# Patient Record
Sex: Male | Born: 1995 | Race: Black or African American | Hispanic: No | Marital: Single | Smoking: Never smoker
Health system: Southern US, Community
[De-identification: ages and names within clinical notes are randomized; demographics above are authoritative.]

## PROBLEM LIST (undated history)

## (undated) DIAGNOSIS — K259 Gastric ulcer, unspecified as acute or chronic, without hemorrhage or perforation: Secondary | ICD-10-CM

## (undated) HISTORY — PX: SHOULDER SURGERY: SHX246

## (undated) HISTORY — PX: ANKLE FRACTURE SURGERY: SHX122

---
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
## 2014-03-23 ENCOUNTER — Emergency Department (HOSPITAL_BASED_OUTPATIENT_CLINIC_OR_DEPARTMENT_OTHER)
Admission: EM | Admit: 2014-03-23 | Discharge: 2014-03-24 | Disposition: A | Discharge status: Home / Self Care | Payer: No Typology Code available for payment source | Attending: Emergency Medicine | Admitting: Emergency Medicine

## 2014-03-23 ENCOUNTER — Encounter (HOSPITAL_BASED_OUTPATIENT_CLINIC_OR_DEPARTMENT_OTHER): Payer: Self-pay | Admitting: Emergency Medicine

## 2014-03-23 DIAGNOSIS — R21 Rash and other nonspecific skin eruption: Secondary | ICD-10-CM | POA: Insufficient documentation

## 2014-03-23 MED ORDER — VALACYCLOVIR HCL 1 G PO TABS: 1000.0000 mg | ORAL_TABLET | Freq: Two times a day (BID) | ORAL | Status: AC

## 2014-03-23 NOTE — ED Notes (Signed)
Pt denies penile discharge, dysuria, or low abdominal pain.  Patient educated extensively about the use of protection at every sexual encounter.  Informed of the danger of STI's, the effect on his health, and being an asymptomatic carrier of STI's.

## 2014-03-23 NOTE — ED Notes (Signed)
Pt reports blistered rash to genital area for 3 days- last sexual contact 1 week ago

## 2014-03-23 NOTE — ED Provider Notes (Signed)
CSN: [id number]     Arrival date & time 03/23/14  2136 History   First MD Initiated Contact with Patient 03/23/14 2324     Chief Complaint  Patient presents with  . Rash     (Consider location/radiation/quality/duration/timing/severity/associated sxs/prior Treatment) HPI This is a 18 year old male who's been sexually active with 2 new partners in the last 2 weeks. He has been having unprotected sex. Is here with a three-day history of rash to his penile shaft and pubic region. It is not painful. It is not itchy. He denies dysuria or penile discharge. He only sometimes uses a condom. He denies any systemic complaints including fever, chills, nausea, vomiting or diarrhea. He has been coughing.  History reviewed. No pertinent past medical history. Past Surgical History  Procedure Laterality Date  . Ankle fracture surgery     Family History  Problem Relation Age of Onset  . Hyperlipidemia Father   . Hypertension Father   . Diabetes Neg Hx   . Heart attack Neg Hx   . Sudden death Neg Hx    History  Substance Use Topics  . Smoking status: Passive Smoke Exposure - Never Smoker  . Smokeless tobacco: Never Used  . Alcohol Use: No    Review of Systems  All other systems reviewed and are negative.   Allergies  Review of patient's allergies indicates no known allergies.  Home Medications  No current outpatient prescriptions on file. BP 134/81  Pulse 61  Temp(Src) 98.5 F (36.9 C) (Oral)  Resp 20  Ht 5\' 9"  (1.753 m)  Wt 175 lb (79.379 kg)  BMI 25.83 kg/m2  SpO2 100%  Physical Exam General: Well-developed, well-nourished male in no acute distress; appearance consistent with age of record HENT: normocephalic; atraumatic Eyes: pupils equal, round and reactive to light; extraocular muscles intact Neck: supple Heart: regular rate and rhythm Lungs: clear to auscultation bilaterally Abdomen: soft; nondistended; nontender; no masses or hepatosplenomegaly; bowel sounds  present GU: Tanner 4 male, circumcised; no urethral discharge; small papular rash to penile shaft and pubic area without vesicles Extremities: No deformity; full range of motion Neurologic: Awake, alert and oriented; motor function intact in all extremities and symmetric; no facial droop Skin: Warm and dry Psychiatric: Normal mood and affect     ED Course  Procedures (including critical care time)   MDM  We'll treat for herpes as a precaution although the rash is not pathognomonic. Herpes culture and GC/chlamydia probe sent.      Hanley SeamenJohn L Kash Davie, MD 03/23/14 [phone]

## 2014-03-24 LAB — HIV ANTIBODY (ROUTINE TESTING W REFLEX): HIV: NONREACTIVE

## 2014-03-25 LAB — HERPES SIMPLEX VIRUS CULTURE: CULTURE: NOT DETECTED

## 2014-03-25 LAB — GC/CHLAMYDIA PROBE AMP
CT Probe RNA: NEGATIVE
GC Probe RNA: POSITIVE — AB

## 2014-03-26 ENCOUNTER — Telehealth (HOSPITAL_BASED_OUTPATIENT_CLINIC_OR_DEPARTMENT_OTHER): Payer: Self-pay

## 2014-03-26 NOTE — Telephone Encounter (Signed)
Pt calling for STD results.  ID verified x 2.  Pt informed Chlamydia was  (-)  but gonorrhea (+).  HIV no reactive and Herpes not detected. Informed pt chart to MD for review then will call pt back.

## 2014-03-27 NOTE — Telephone Encounter (Signed)
+  Gonorrhea. Chart sent to EDP office for review. [facility]

## 2014-03-29 ENCOUNTER — Telehealth (HOSPITAL_BASED_OUTPATIENT_CLINIC_OR_DEPARTMENT_OTHER): Payer: Self-pay

## 2014-04-01 ENCOUNTER — Telehealth (HOSPITAL_BASED_OUTPATIENT_CLINIC_OR_DEPARTMENT_OTHER): Payer: Self-pay

## 2014-04-01 NOTE — Telephone Encounter (Signed)
[facility] [address] requesting copy of pts STD testing so he can be treated for STD gonorrhea.  No sheet for pt rcvd back in office to date from [facility] w/tx.  Pt signed fax cover sheet request for releasing information.  STD lab result faxed to [facility] [phone] Attn Michelle/Nancy Keeler PA-C.

## 2015-12-22 ENCOUNTER — Encounter (HOSPITAL_BASED_OUTPATIENT_CLINIC_OR_DEPARTMENT_OTHER): Payer: Self-pay | Admitting: *Deleted

## 2015-12-22 ENCOUNTER — Emergency Department (HOSPITAL_BASED_OUTPATIENT_CLINIC_OR_DEPARTMENT_OTHER): Payer: BLUE CROSS/BLUE SHIELD

## 2015-12-22 ENCOUNTER — Emergency Department (HOSPITAL_BASED_OUTPATIENT_CLINIC_OR_DEPARTMENT_OTHER)
Admission: EM | Admit: 2015-12-22 | Discharge: 2015-12-22 | Disposition: A | Discharge status: Home / Self Care | Payer: BLUE CROSS/BLUE SHIELD | Attending: Emergency Medicine | Admitting: Emergency Medicine

## 2015-12-22 DIAGNOSIS — Y9289 Other specified places as the place of occurrence of the external cause: Secondary | ICD-10-CM | POA: Diagnosis not present

## 2015-12-22 DIAGNOSIS — Y998 Other external cause status: Secondary | ICD-10-CM | POA: Diagnosis not present

## 2015-12-22 DIAGNOSIS — W57XXXA Bitten or stung by nonvenomous insect and other nonvenomous arthropods, initial encounter: Secondary | ICD-10-CM | POA: Insufficient documentation

## 2015-12-22 DIAGNOSIS — Y9389 Activity, other specified: Secondary | ICD-10-CM | POA: Diagnosis not present

## 2015-12-22 DIAGNOSIS — S80261A Insect bite (nonvenomous), right knee, initial encounter: Secondary | ICD-10-CM | POA: Diagnosis not present

## 2015-12-22 MED ORDER — DIPHENHYDRAMINE HCL 25 MG PO CAPS: 25.0000 mg | ORAL_CAPSULE | Freq: Four times a day (QID) | ORAL | Status: DC | PRN

## 2015-12-22 NOTE — Discharge Instructions (Signed)
Insect Bite Mosquitoes, flies, fleas, bedbugs, and many other insects can bite. Insect bites are different from insect stings. A sting is when poison (venom) is injected into the skin. Insect bites can cause pain or itching for a few days, but they are usually not serious. Some insects can spread diseases to people through a bite. SYMPTOMS  Symptoms of an insect bite include:  Itching or pain in the bite area.  Redness and swelling in the bite area.  An open wound (skin ulcer). In many cases, symptoms last for 2-4 days.  DIAGNOSIS  This condition is usually diagnosed based on symptoms and a physical exam. TREATMENT  Treatment is usually not needed for an insect bite. Symptoms often go away on their own. Your health care provider may recommend creams or lotions to help reduce itching. Antibiotic medicines may be prescribed if the bite becomes infected. A tetanus shot may be given in some cases. If you develop an allergic reaction to an insect bite, your health care provider will prescribe medicines to treat the reaction (antihistamines). This is rare. HOME CARE INSTRUCTIONS  Do not scratch the bite area.  Keep the bite area clean and dry. Wash the bite area daily with soap and water as told by your health care provider.  If directed, applyice to the bite area.  Put ice in a plastic bag.  Place a towel between your skin and the bag.  Leave the ice on for 20 minutes, 2-3 times per day.  To help reduce itching and swelling, try applying a baking soda paste, cortisone cream, or calamine lotion to the bite area as told by your health care provider.  Apply or take over-the-counter and prescription medicines only as told by your health care provider.  If you were prescribed an antibiotic medicine, use it as told by your health care provider. Do not stop using the antibiotic even if your condition improves.  Keep all follow-up visits as told by your health care provider. This is  important. PREVENTION   Use insect repellent. The best insect repellents contain:  DEET, picaridin, oil of lemon eucalyptus (OLE), or IR3535.  Higher amounts of an active ingredient.  When you are outdoors, wear clothing that covers your arms and legs.  Avoid opening windows that do not have window screens. SEEK MEDICAL CARE IF:  You have increased redness, swelling, or pain in the bite area.  You have a fever. SEEK IMMEDIATE MEDICAL CARE IF:   You have joint pain.   You have fluid, blood, or pus coming from the bite area.  You have a headache or neck pain.  You have unusual weakness.  You have a rash.  You have chest pain or shortness of breath.  You have abdominal pain, nausea, or vomiting.  You feel unusually tired or sleepy.   This information is not intended to replace advice given to you by your health care provider. Make sure you discuss any questions you have with your health care provider.   Document Released: 01/13/2005 Document Revised: 08/27/2015 Document Reviewed: 04/23/2015 Elsevier Interactive Patient Education 2016 [employer]  

## 2015-12-22 NOTE — ED Provider Notes (Signed)
CSN: [id number]     Arrival date & time 12/22/15  1625 History   First MD Initiated Contact with Patient 12/22/15 1750     Chief Complaint  Patient presents with  . Insect Bite     (Consider location/radiation/quality/duration/timing/severity/associated sxs/prior Treatment) Patient is a 20 y.o. male presenting with animal bite.  Animal Bite Contact animal:  Insect Animal bite location: right knee. Time since incident:  1 day Pain details:    Quality:  Aching   Severity:  Mild   Timing:  Constant   Progression:  Worsening Relieved by:  None tried Worsened by:  Nothing tried Ineffective treatments:  None tried Associated symptoms: swelling (mild)   Associated symptoms: no fever, no numbness and no rash    Kaymen Adrian is a 20 y.o. male with PMH significant for ankle fx surgery who presents with constant, mild, worsening insect bite x 1 day with assoc erythema and mild swelling on his right knee.  No fever, chills, N/V, abdominal pain.  Patient ambulatory.  No meds PTA.    History reviewed. No pertinent past medical history. Past Surgical History  Procedure Laterality Date  . Ankle fracture surgery     Family History  Problem Relation Age of Onset  . Hyperlipidemia Father   . Hypertension Father   . Diabetes Neg Hx   . Heart attack Neg Hx   . Sudden death Neg Hx    Social History  Substance Use Topics  . Smoking status: Passive Smoke Exposure - Never Smoker  . Smokeless tobacco: Never Used  . Alcohol Use: No    Review of Systems  Constitutional: Negative for fever and chills.  Gastrointestinal: Negative for nausea, vomiting and abdominal pain.  Genitourinary: Negative for dysuria, urgency, frequency, discharge, penile swelling and penile pain.  Skin: Negative for rash.  Neurological: Negative for weakness and numbness.  All other systems reviewed and are negative.     Allergies  Review of patient's allergies indicates no known allergies.  Home Medications    Prior to Admission medications   Not on File   BP 136/82 mmHg  Pulse 76  Temp(Src) 98.5 F (36.9 C) (Oral)  Resp 20  Ht 5\' 9"  (1.753 m)  Wt 81.647 kg  BMI 26.57 kg/m2  SpO2 100% Physical Exam  Constitutional: He is oriented to person, place, and time. He appears well-developed and well-nourished.  HENT:  Head: Atraumatic.  Eyes: Conjunctivae are normal. No scleral icterus.  Neck: No tracheal deviation present.  Pulmonary/Chest: Effort normal. No respiratory distress.  Musculoskeletal: Normal range of motion.  Neurological: He is alert and oriented to person, place, and time.  Strength and sensation intact bilaterally throughout lower extremities.  Gait normal.   Skin: Skin is warm and dry.  Lesion consistent with insect bite on medial aspect of right knee with surrounding erythema and mild swelling.  No induration, drainage, or signs of infection.   Psychiatric: He has a normal mood and affect. His behavior is normal.    ED Course  Procedures (including critical care time) Labs Review Labs Reviewed - No data to display  Imaging Review No results found. I have personally reviewed and evaluated these images and lab results as part of my medical decision-making.   EKG Interpretation None      MDM   Final diagnoses:  Insect bite   Patient presents with findings consistent with insect bite.  No fevers, N/V, abdominal pain.  No urinary symptoms or penile discharge.  Blood pressure 136/82,  pulse 76, temperature 98.5 F (36.9 C), temperature source Oral, resp. rate 20, height 5\' 9"  (1.753 m), weight 81.647 kg, SpO2 100 %.  On exam, lesion consistent with insect bite and mild erythema with swelling on right medial knee.  FAROM of right knee.  Patient ambulatory.  Doubt septic arthritis.  Patient appears well and non-toxic.  VSS.  Will d/c home with benadryl.  Discussed strict return precautions.  Patient agrees and acknowledges the above plan for discharge.       Cheri FowlerKayla  Jordon Bourquin, PA-C 12/22/15 1839  Margarita Grizzleanielle Ray, MD 12/22/15 2322

## 2015-12-22 NOTE — ED Notes (Signed)
Pimple on his right knee for 2 days. He thinks it is a spider bite.

## 2020-12-06 ENCOUNTER — Other Ambulatory Visit: Payer: Self-pay

## 2020-12-06 ENCOUNTER — Emergency Department (HOSPITAL_COMMUNITY): Payer: No Typology Code available for payment source

## 2020-12-06 ENCOUNTER — Encounter (HOSPITAL_COMMUNITY): Payer: Self-pay | Admitting: Emergency Medicine

## 2020-12-06 ENCOUNTER — Emergency Department (HOSPITAL_COMMUNITY)
Admission: EM | Admit: 2020-12-06 | Discharge: 2020-12-06 | Discharge status: Against Medical Advice | Payer: No Typology Code available for payment source | Attending: Emergency Medicine | Admitting: Emergency Medicine

## 2020-12-06 DIAGNOSIS — S29002A Unspecified injury of muscle and tendon of back wall of thorax, initial encounter: Secondary | ICD-10-CM | POA: Diagnosis present

## 2020-12-06 DIAGNOSIS — S29012A Strain of muscle and tendon of back wall of thorax, initial encounter: Secondary | ICD-10-CM | POA: Diagnosis not present

## 2020-12-06 DIAGNOSIS — Y92410 Unspecified street and highway as the place of occurrence of the external cause: Secondary | ICD-10-CM | POA: Diagnosis not present

## 2020-12-06 DIAGNOSIS — Z7722 Contact with and (suspected) exposure to environmental tobacco smoke (acute) (chronic): Secondary | ICD-10-CM | POA: Insufficient documentation

## 2020-12-06 DIAGNOSIS — S39012A Strain of muscle, fascia and tendon of lower back, initial encounter: Secondary | ICD-10-CM

## 2020-12-06 MED ORDER — IBUPROFEN 400 MG PO TABS: 600.0000 mg | ORAL_TABLET | Freq: Once | ORAL | Status: DC

## 2020-12-06 MED ORDER — CYCLOBENZAPRINE HCL 10 MG PO TABS: 10.0000 mg | ORAL_TABLET | Freq: Three times a day (TID) | ORAL | 0 refills | Status: DC | PRN
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
## 2020-12-06 NOTE — ED Notes (Signed)
Dr. Stevie Kern has pt's discharge instructions at triage but unable to locate pt in lobby at this time.
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
## 2020-12-06 NOTE — Discharge Instructions (Signed)
Take Tylenol Motrin as needed for pain control.  Recommend starting muscle relaxer as needed.  Note this can make you slightly drowsy and should not be taken while driving or operating heavy machinery.  If you develop other new concerning symptom, return to ER for reassessment.

## 2020-12-08 ENCOUNTER — Emergency Department (HOSPITAL_COMMUNITY)
Admission: EM | Admit: 2020-12-08 | Discharge: 2020-12-09 | Disposition: A | Discharge status: Home / Self Care | Payer: 59 | Attending: Emergency Medicine | Admitting: Emergency Medicine

## 2020-12-08 ENCOUNTER — Encounter (HOSPITAL_COMMUNITY): Payer: Self-pay | Admitting: Emergency Medicine

## 2020-12-08 ENCOUNTER — Other Ambulatory Visit: Payer: Self-pay

## 2020-12-08 DIAGNOSIS — M545 Low back pain, unspecified: Secondary | ICD-10-CM | POA: Diagnosis present

## 2020-12-08 DIAGNOSIS — Z5321 Procedure and treatment not carried out due to patient leaving prior to being seen by health care provider: Secondary | ICD-10-CM | POA: Insufficient documentation
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
## 2020-12-09 NOTE — ED Notes (Signed)
Pt did not want to stay any longer due to wait.
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
## 2022-05-10 ENCOUNTER — Other Ambulatory Visit: Payer: Self-pay

## 2022-05-10 ENCOUNTER — Encounter (HOSPITAL_BASED_OUTPATIENT_CLINIC_OR_DEPARTMENT_OTHER): Payer: Self-pay | Admitting: Emergency Medicine

## 2022-05-10 ENCOUNTER — Emergency Department (HOSPITAL_BASED_OUTPATIENT_CLINIC_OR_DEPARTMENT_OTHER)
Admission: EM | Admit: 2022-05-10 | Discharge: 2022-05-10 | Disposition: A | Discharge status: Home / Self Care | Payer: Self-pay | Attending: Emergency Medicine | Admitting: Emergency Medicine

## 2022-05-10 ENCOUNTER — Emergency Department (HOSPITAL_BASED_OUTPATIENT_CLINIC_OR_DEPARTMENT_OTHER): Payer: Self-pay

## 2022-05-10 DIAGNOSIS — R509 Fever, unspecified: Secondary | ICD-10-CM | POA: Insufficient documentation

## 2022-05-10 DIAGNOSIS — J111 Influenza due to unidentified influenza virus with other respiratory manifestations: Secondary | ICD-10-CM

## 2022-05-10 DIAGNOSIS — R52 Pain, unspecified: Secondary | ICD-10-CM | POA: Insufficient documentation

## 2022-05-10 DIAGNOSIS — R110 Nausea: Secondary | ICD-10-CM | POA: Insufficient documentation

## 2022-05-10 DIAGNOSIS — R066 Hiccough: Secondary | ICD-10-CM | POA: Insufficient documentation

## 2022-05-10 DIAGNOSIS — R059 Cough, unspecified: Secondary | ICD-10-CM | POA: Insufficient documentation

## 2022-05-10 DIAGNOSIS — Z20822 Contact with and (suspected) exposure to covid-19: Secondary | ICD-10-CM | POA: Insufficient documentation

## 2022-05-10 LAB — RESP PANEL BY RT-PCR (FLU A&B, COVID) ARPGX2
Influenza A by PCR: NEGATIVE
Influenza B by PCR: NEGATIVE
SARS Coronavirus 2 by RT PCR: NEGATIVE

## 2022-05-10 MED ORDER — CHLORPROMAZINE HCL 25 MG PO TABS: 25.0000 mg | ORAL_TABLET | Freq: Four times a day (QID) | ORAL | 0 refills | Status: DC | PRN

## 2022-05-10 MED ORDER — CHLORPROMAZINE HCL 25 MG PO TABS
25.0000 mg | ORAL_TABLET | Freq: Once | ORAL | Status: AC
  Administered 2022-05-10: 25 mg via ORAL
  Filled 2022-05-10: qty 1

## 2022-05-10 NOTE — ED Provider Notes (Signed)
[facility] EMERGENCY DEPARTMENT Provider Note   CSN: [id number] Arrival date & time: 05/10/22  1952     History  Chief Complaint  Patient presents with   Hiccups    Carlos Townsend is a 26 y.o. male.  The history is provided by the patient.  He has no significant past history and comes in with a cough throughout the day today.  He started getting sick last night with subjective fever, chills, cough productive of small amount of green sputum.  He has also had some body aches.  There was nausea, vomiting, diarrhea.  Vomiting and diarrhea stopped this morning but hiccups started this morning.  He states that he has had hiccups through most of the day, although we will have periods of 30-60 minutes where they stop.  He continues to have the cough and body aches.  Denies any sick contacts.   Home Medications Prior to Admission medications   Medication Sig Start Date End Date Taking? Authorizing Provider  cyclobenzaprine (FLEXERIL) 10 MG tablet Take 1 tablet (10 mg total) by mouth 3 (three) times daily as needed for muscle spasms. 12/06/20   Milagros Loll, MD  diphenhydrAMINE (BENADRYL) 25 mg capsule Take 1 capsule (25 mg total) by mouth every 6 (six) hours as needed. 12/22/15   Cheri Fowler, PA-C      Allergies    Patient has no known allergies.    Review of Systems   Review of Systems  All other systems reviewed and are negative.  Physical Exam Updated Vital Signs BP (!) 124/98 (BP Location: Left Arm)   Pulse 96   Temp 99.3 F (37.4 C) (Oral)   Resp 16   Ht 5\' 9"  (1.753 m)   Wt 79.4 kg   SpO2 99%   BMI 25.84 kg/m  Physical Exam Vitals and nursing note reviewed.  26 year old male, resting comfortably and in no acute distress. Vital signs are significant for elevated diastolic blood pressure. Oxygen saturation is 99%, which is normal. Head is normocephalic and atraumatic. PERRLA, EOMI. Oropharynx is clear. Neck is nontender and supple without adenopathy or  JVD. Back is nontender and there is no CVA tenderness. Lungs are clear without rales, wheezes, or rhonchi. Chest is nontender. Heart has regular rate and rhythm without murmur. Abdomen is soft, flat, nontender. Extremities have no cyanosis or edema, full range of motion is present. Skin is warm and dry without rash. Neurologic: Mental status is normal, cranial nerves are intact, moves all extremities equally.  ED Results / Procedures / Treatments   Labs (all labs ordered are listed, but only abnormal results are displayed) Labs Reviewed  RESP PANEL BY RT-PCR (FLU A&B, COVID) [id number]   Radiology DG Chest 2 View  Result Date: 05/10/2022 CLINICAL DATA:  Cough EXAM: CHEST - 2 VIEW COMPARISON:  None Available. FINDINGS: The heart size and mediastinal contours are within normal limits. Both lungs are clear. The visualized skeletal structures are unremarkable. IMPRESSION: No active cardiopulmonary disease. Electronically Signed   By: 05/12/2022 M.D.   On: 05/10/2022 23:26    Procedures Procedures    Medications Ordered in ED Medications  chlorproMAZINE (THORAZINE) tablet 25 mg (25 mg Oral Given 05/10/22 2331)    ED Course/ Medical Decision Making/ A&P                           Medical Decision Making Amount and/or Complexity of Data Reviewed Radiology: ordered.  Risk  Prescription drug management.   Influenza-like illness with subjective fever, cough, body aches, hiccups.  Vomiting and diarrhea retention initially, have subsided.  Respiratory pathogen panel was obtained and I have interpreted the results and he does not have influenza or COVID-19.  We will check chest x-ray to rule out pneumonia.  I have ordered a dose of chlorpromazine for his hiccups.  Chest x-ray shows no evidence of pneumonia.  I independently viewed the images, and agree with radiologist's interpretation.  He is discharged with a prescription for chlorpromazine, advised to use acetaminophen and ibuprofen for  fever or aching.  Final Clinical Impression(s) / ED Diagnoses Final diagnoses:  Hiccups  Influenza-like illness    Rx / DC Orders ED Discharge Orders          Ordered    chlorproMAZINE (THORAZINE) 25 MG tablet  Every 6 hours PRN        05/10/22 2331              Dione Booze, MD 05/10/22 2333

## 2022-05-10 NOTE — Discharge Instructions (Signed)
Take ibuprofen or acetaminophen as needed for fever or aches.  Make sure to drink plenty of fluids.

## 2022-05-10 NOTE — ED Triage Notes (Signed)
Patient reports intermittent hiccups since last night, states hiccups will stop for about 5-10 minutes and start back for 30-45 minutes.

## 2022-08-28 ENCOUNTER — Emergency Department (HOSPITAL_BASED_OUTPATIENT_CLINIC_OR_DEPARTMENT_OTHER)
Admission: EM | Admit: 2022-08-28 | Discharge: 2022-08-28 | Disposition: A | Discharge status: Home / Self Care | Payer: Self-pay | Attending: Emergency Medicine | Admitting: Emergency Medicine

## 2022-08-28 ENCOUNTER — Other Ambulatory Visit: Payer: Self-pay

## 2022-08-28 ENCOUNTER — Encounter (HOSPITAL_BASED_OUTPATIENT_CLINIC_OR_DEPARTMENT_OTHER): Payer: Self-pay

## 2022-08-28 ENCOUNTER — Emergency Department (HOSPITAL_BASED_OUTPATIENT_CLINIC_OR_DEPARTMENT_OTHER): Payer: Self-pay

## 2022-08-28 DIAGNOSIS — D72829 Elevated white blood cell count, unspecified: Secondary | ICD-10-CM | POA: Insufficient documentation

## 2022-08-28 DIAGNOSIS — R1010 Upper abdominal pain, unspecified: Secondary | ICD-10-CM | POA: Insufficient documentation

## 2022-08-28 DIAGNOSIS — R748 Abnormal levels of other serum enzymes: Secondary | ICD-10-CM | POA: Insufficient documentation

## 2022-08-28 DIAGNOSIS — R197 Diarrhea, unspecified: Secondary | ICD-10-CM | POA: Insufficient documentation

## 2022-08-28 DIAGNOSIS — R112 Nausea with vomiting, unspecified: Secondary | ICD-10-CM | POA: Insufficient documentation

## 2022-08-28 DIAGNOSIS — N179 Acute kidney failure, unspecified: Secondary | ICD-10-CM | POA: Insufficient documentation

## 2022-08-28 LAB — LIPASE, BLOOD: Lipase: 62 U/L — ABNORMAL HIGH (ref 11–51)

## 2022-08-28 LAB — COMPREHENSIVE METABOLIC PANEL
ALT: 18 U/L (ref 0–44)
AST: 21 U/L (ref 15–41)
Albumin: 4.4 g/dL (ref 3.5–5.0)
Alkaline Phosphatase: 78 U/L (ref 38–126)
Anion gap: 8 (ref 5–15)
BUN: 12 mg/dL (ref 6–20)
CO2: 26 mmol/L (ref 22–32)
Calcium: 9.4 mg/dL (ref 8.9–10.3)
Chloride: 105 mmol/L (ref 98–111)
Creatinine, Ser: 1.26 mg/dL — ABNORMAL HIGH (ref 0.61–1.24)
GFR, Estimated: >60 mL/min (ref >60)
Glucose, Bld: 109 mg/dL — ABNORMAL HIGH (ref 70–99)
Potassium: 3.4 mmol/L — ABNORMAL LOW (ref 3.5–5.1)
Sodium: 139 mmol/L (ref 135–145)
Total Bilirubin: 1.3 mg/dL — ABNORMAL HIGH (ref 0.3–1.2)
Total Protein: 7.8 g/dL (ref 6.5–8.1)

## 2022-08-28 LAB — CBC
HCT: 42.8 % (ref 39.0–52.0)
Hemoglobin: 14.2 g/dL (ref 13.0–17.0)
MCH: 26.3 pg (ref 26.0–34.0)
MCHC: 33.2 g/dL (ref 30.0–36.0)
MCV: 79.4 fL — ABNORMAL LOW (ref 80.0–100.0)
Platelets: 267 10*3/uL (ref 150–400)
RBC: 5.39 MIL/uL (ref 4.22–5.81)
RDW: 12.9 % (ref 11.5–15.5)
WBC: 12.8 10*3/uL — ABNORMAL HIGH (ref 4.0–10.5)
nRBC: 0 % (ref 0.0–0.2)

## 2022-08-28 LAB — URINALYSIS, ROUTINE W REFLEX MICROSCOPIC
Bilirubin Urine: NEGATIVE
Glucose, UA: NEGATIVE mg/dL
Hgb urine dipstick: NEGATIVE
Ketones, ur: 15 mg/dL — AB
Leukocytes,Ua: NEGATIVE
Nitrite: NEGATIVE
Protein, ur: NEGATIVE mg/dL
Specific Gravity, Urine: 1.015 (ref 1.005–1.030)
pH: 8.5 — ABNORMAL HIGH (ref 5.0–8.0)

## 2022-08-28 MED ORDER — PANTOPRAZOLE SODIUM 40 MG IV SOLR
40.0000 mg | Freq: Once | INTRAVENOUS | Status: AC
  Administered 2022-08-28: 40 mg via INTRAVENOUS
  Filled 2022-08-28: qty 10

## 2022-08-28 MED ORDER — METOCLOPRAMIDE HCL 5 MG/ML IJ SOLN
10.0000 mg | Freq: Once | INTRAMUSCULAR | Status: AC
  Administered 2022-08-28: 10 mg via INTRAVENOUS
  Filled 2022-08-28: qty 2

## 2022-08-28 MED ORDER — OMEPRAZOLE 20 MG PO CPDR: 20.0000 mg | DELAYED_RELEASE_CAPSULE | Freq: Every day | ORAL | 0 refills | Status: DC

## 2022-08-28 MED ORDER — METOCLOPRAMIDE HCL 10 MG PO TABS: 10.0000 mg | ORAL_TABLET | Freq: Four times a day (QID) | ORAL | 0 refills | Status: DC

## 2022-08-28 MED ORDER — LACTATED RINGERS IV BOLUS
1000.0000 mL | Freq: Once | INTRAVENOUS | Status: AC
  Administered 2022-08-28: 1000 mL via INTRAVENOUS

## 2022-08-28 NOTE — ED Triage Notes (Signed)
Pt reports abd pain and vomiting that has been ongoing since his stomach flu dx 2 weeks ago. Pt states his main concern is that he throws up everytime he eats, but his sx are not as bad as when he had the stomach flu. Denies fever; endorses diarrhea intermittently.

## 2022-08-28 NOTE — Discharge Instructions (Addendum)
In addition to the food options that were provided you can stick with just chicken noodle soup and broth for the first day or 2 until the abdominal pain resolves.  You can use the nausea medication as needed and use the antacid daily.

## 2022-08-28 NOTE — ED Provider Notes (Signed)
[facility] EMERGENCY DEPARTMENT Provider Note   CSN: [id number] Arrival date & time: 08/28/22  1947     History  Chief Complaint  Patient presents with   Abdominal Pain    Carlos Townsend is a 26 y.o. male.  Patient is a 26 year old male with no significant past medical history who is presenting today with ongoing vomiting and abdominal pain with intermittent diarrhea.  Patient reports that 3 weeks ago he had a stomach bug where he was vomiting and diarrhea frequently for approximately 1 week which then resolved for about a week and then in the last 4 to 5 days has come back.  Patient reports that he had been on a cruise and got sick before he got on the ship and was sick the entire vacation.  He reports that he does not really have much pain as long as he does not eat but when he eats he starts having upper abdominal pain and then will eventually vomit.  He is having at least 1-3 diarrhea stools per day and reports it is also associated with eating.  He has not had any fever and he has no family members who are sick with similar symptoms.  He does report over the last year he has had some issues with his stomach but never anything this severe.  He denies any stomach pain at this time and he has not eaten so he is not having nausea currently.  He denies any alcohol use or NSAID use.  He has been trying to use Pepto-Bismol but reports it does not help.  He does smoke marijuana daily for over 5 years.  The history is provided by the patient.  Abdominal Pain      Home Medications Prior to Admission medications   Medication Sig Start Date End Date Taking? Authorizing Provider  metoCLOPramide (REGLAN) 10 MG tablet Take 1 tablet (10 mg total) by mouth every 6 (six) hours. 08/28/22  Yes Gwyneth Sprout, MD  omeprazole (PRILOSEC) 20 MG capsule Take 1 capsule (20 mg total) by mouth daily. 08/28/22  Yes Gwyneth Sprout, MD  chlorproMAZINE (THORAZINE) 25 MG tablet Take 1 tablet (25 mg  total) by mouth every 6 (six) hours as needed for hiccoughs. 05/10/22   Dione Booze, MD  cyclobenzaprine (FLEXERIL) 10 MG tablet Take 1 tablet (10 mg total) by mouth 3 (three) times daily as needed for muscle spasms. 12/06/20   Milagros Loll, MD  diphenhydrAMINE (BENADRYL) 25 mg capsule Take 1 capsule (25 mg total) by mouth every 6 (six) hours as needed. 12/22/15   Cheri Fowler, PA-C      Allergies    Patient has no known allergies.    Review of Systems   Review of Systems  Gastrointestinal:  Positive for abdominal pain.    Physical Exam Updated Vital Signs BP 129/73   Pulse 76   Temp 98.6 F (37 C) (Oral)   Resp 16   Ht 5\' 9"  (1.753 m)   Wt 74.8 kg   SpO2 99%   BMI 24.37 kg/m  Physical Exam Vitals and nursing note reviewed.  Constitutional:      General: He is not in acute distress.    Appearance: Normal appearance. He is well-developed.  HENT:     Head: Normocephalic and atraumatic.     Mouth/Throat:     Mouth: Mucous membranes are dry.  Eyes:     Conjunctiva/sclera: Conjunctivae normal.     Pupils: Pupils are equal, round, and reactive to  light.  Cardiovascular:     Rate and Rhythm: Normal rate and regular rhythm.     Heart sounds: No murmur heard. Pulmonary:     Effort: Pulmonary effort is normal. No respiratory distress.     Breath sounds: Normal breath sounds. No wheezing or rales.  Abdominal:     General: There is no distension.     Palpations: Abdomen is soft.     Tenderness: There is no abdominal tenderness. There is no guarding or rebound.  Musculoskeletal:        General: No tenderness. Normal range of motion.     Cervical back: Normal range of motion and neck supple.  Skin:    General: Skin is warm and dry.     Findings: No erythema or rash.  Neurological:     Mental Status: He is alert and oriented to person, place, and time. Mental status is at baseline.  Psychiatric:        Mood and Affect: Mood normal.        Behavior: Behavior normal.      ED Results / Procedures / Treatments   Labs (all labs ordered are listed, but only abnormal results are displayed) Labs Reviewed  LIPASE, BLOOD - Abnormal; Notable for the following components:      Result Value   Lipase 62 (*)    All other components within normal limits  COMPREHENSIVE METABOLIC PANEL - Abnormal; Notable for the following components:   Potassium 3.4 (*)    Glucose, Bld 109 (*)    Creatinine, Ser 1.26 (*)    Total Bilirubin 1.3 (*)    All other components within normal limits  CBC - Abnormal; Notable for the following components:   WBC 12.8 (*)    MCV 79.4 (*)    All other components within normal limits  URINALYSIS, ROUTINE W REFLEX MICROSCOPIC - Abnormal; Notable for the following components:   APPearance HAZY (*)    pH 8.5 (*)    Ketones, ur 15 (*)    All other components within normal limits    EKG EKG Interpretation  Date/Time:  Saturday August 28 2022 20:05:57 EDT Ventricular Rate:  80 PR Interval:  146 QRS Duration: 86 QT Interval:  408 QTC Calculation: 470 R Axis:   85 Text Interpretation: Normal sinus rhythm with sinus arrhythmia T wave abnormality, consider inferior ischemia Prolonged QT No previous ECGs available Confirmed by Gwyneth Sprout ([id number]) on 08/28/2022 8:10:43 PM  Radiology DG Abdomen 1 View  Result Date: 08/28/2022 CLINICAL DATA:  Vomiting. EXAM: ABDOMEN - 1 VIEW COMPARISON:  None Available. FINDINGS: Artifacts overlie the lower abdomen. The bowel gas pattern is normal. No radio-opaque calculi or other significant radiographic abnormality are seen. IMPRESSION: Negative. Electronically Signed   By: Darliss Cheney M.D.   On: 08/28/2022 21:12    Procedures Procedures    Medications Ordered in ED Medications  lactated ringers bolus 1,000 mL (0 mLs Intravenous Stopped 08/28/22 2156)  pantoprazole (PROTONIX) injection 40 mg (40 mg Intravenous Given 08/28/22 2055)  metoCLOPramide (REGLAN) injection 10 mg (10 mg Intravenous Given  08/28/22 2055)    ED Course/ Medical Decision Making/ A&P                           Medical Decision Making Amount and/or Complexity of Data Reviewed Labs: ordered. Decision-making details documented in ED Course. Radiology: ordered and independent interpretation performed. Decision-making details documented in ED Course.  Risk Prescription drug  management.   Pt presenting today with a complaint that caries a high risk for morbidity and mortality.  Patient is having recurrent nausea vomiting, upper abdominal pain and diarrhea that has returned.  He reports being sick 3 weeks ago but it was improved for a week and in the last 4 to 5 days symptoms have returned.  Patient was on a cruise during this time but started feeling sick prior to boarding the ship.  Nobody he was with during the trip was sick.  He has not had any blood in his stool and lower suspicion for an acute bacterial cause of his symptoms.  Patient has no abdominal pain at this time and seems to all be focused around when he eats.  He does not use alcohol but does smoke marijuana daily.  Concern for pancreatitis, PUD, dehydration, cannabis hyperemesis syndrome.  Patient's exam is not consistent with appendicitis, diverticulitis or cholecystitis.  Lower suspicion for bowel obstruction as patient is having diarrhea.  However we will do a plain film to ensure no dilated bowel. I independently interpreted patient's labs and he has a mildly elevated lipase of 62, CMP with mild AKI today with creatinine of 1.26 normal anion gap and a CBC with a leukocytosis of 12.8.  Patient given IV fluids.  I have independently visualized and interpreted pt's images today.  KUB neg for obstruction.  Pt improved after IVF.  He is well-appearing on exam.  Discussed with the patient and his family member at bedside follow-up with his PCP on Monday as he will still most likely need GI referral.  We will start patient on omeprazole daily discussed bland diet and  transitioning back to a normal diet with time and patient also given an antiemetic for home.  He is comfortable with this plan.  He was discharged home in good condition.          Final Clinical Impression(s) / ED Diagnoses Final diagnoses:  Pain of upper abdomen  Nausea and vomiting, unspecified vomiting type    Rx / DC Orders ED Discharge Orders          Ordered    omeprazole (PRILOSEC) 20 MG capsule  Daily        08/28/22 2214    metoCLOPramide (REGLAN) 10 MG tablet  Every 6 hours        08/28/22 2214              Gwyneth Sprout, MD 08/28/22 2246

## 2022-11-29 ENCOUNTER — Encounter: Payer: Self-pay | Admitting: Internal Medicine

## 2022-11-30 ENCOUNTER — Ambulatory Visit (HOSPITAL_BASED_OUTPATIENT_CLINIC_OR_DEPARTMENT_OTHER)
Admission: RE | Admit: 2022-11-30 | Discharge: 2022-11-30 | Disposition: A | Discharge status: Home / Self Care | Payer: Commercial Managed Care - HMO | Source: Ambulatory Visit | Attending: Medical | Admitting: Medical

## 2022-11-30 ENCOUNTER — Ambulatory Visit (INDEPENDENT_AMBULATORY_CARE_PROVIDER_SITE_OTHER): Payer: Commercial Managed Care - HMO | Admitting: Medical

## 2022-11-30 ENCOUNTER — Encounter: Payer: Self-pay | Admitting: Medical

## 2022-11-30 VITALS — BP 104/68 | HR 86 | Temp 98.0°F | Resp 18 | Ht 69.0 in | Wt 174.0 lb

## 2022-11-30 DIAGNOSIS — R1011 Right upper quadrant pain: Secondary | ICD-10-CM | POA: Insufficient documentation

## 2022-11-30 LAB — COMPREHENSIVE METABOLIC PANEL
ALT: 15 U/L (ref 0–53)
AST: 16 U/L (ref 0–37)
Albumin: 4.9 g/dL (ref 3.5–5.2)
Alkaline Phosphatase: 91 U/L (ref 39–117)
BUN: 13 mg/dL (ref 6–23)
CO2: 29 mEq/L (ref 19–32)
Calcium: 9.9 mg/dL (ref 8.4–10.5)
Chloride: 104 mEq/L (ref 96–112)
Creatinine, Ser: 1.25 mg/dL (ref 0.40–1.50)
GFR: 78.6 mL/min (ref >60.00)
Glucose, Bld: 92 mg/dL (ref 70–99)
Potassium: 4.4 mEq/L (ref 3.5–5.1)
Sodium: 140 mEq/L (ref 135–145)
Total Bilirubin: 1.1 mg/dL (ref 0.2–1.2)
Total Protein: 7.7 g/dL (ref 6.0–8.3)

## 2022-11-30 LAB — CBC WITH DIFFERENTIAL/PLATELET
Basophils Absolute: 0 10*3/uL (ref 0.0–0.1)
Basophils Relative: 0.5 % (ref 0.0–3.0)
Eosinophils Absolute: 0.1 10*3/uL (ref 0.0–0.7)
Eosinophils Relative: 1.8 % (ref 0.0–5.0)
HCT: 43.8 % (ref 39.0–52.0)
Hemoglobin: 14.3 g/dL (ref 13.0–17.0)
Lymphocytes Relative: 36.3 % (ref 12.0–46.0)
Lymphs Abs: 2.9 10*3/uL (ref 0.7–4.0)
MCHC: 32.5 g/dL (ref 30.0–36.0)
MCV: 80.6 fl (ref 78.0–100.0)
Monocytes Absolute: 0.7 10*3/uL (ref 0.1–1.0)
Monocytes Relative: 8.7 % (ref 3.0–12.0)
Neutro Abs: 4.2 10*3/uL (ref 1.4–7.7)
Neutrophils Relative %: 52.7 % (ref 43.0–77.0)
Platelets: 297 10*3/uL (ref 150.0–400.0)
RBC: 5.44 Mil/uL (ref 4.22–5.81)
RDW: 13.4 % (ref 11.5–15.5)
WBC: 8 10*3/uL (ref 4.0–10.5)

## 2022-11-30 LAB — H. PYLORI ANTIBODY, IGG: H Pylori IgG: NEGATIVE

## 2022-11-30 LAB — LIPASE: Lipase: 3 U/L — ABNORMAL LOW (ref 11.0–59.0)

## 2022-11-30 MED ORDER — FAMOTIDINE 20 MG PO TABS: 20.0000 mg | ORAL_TABLET | Freq: Every day | ORAL | 0 refills | Status: DC

## 2022-11-30 MED ORDER — ONDANSETRON HCL 4 MG PO TABS: 4.0000 mg | ORAL_TABLET | Freq: Three times a day (TID) | ORAL | 0 refills | Status: AC | PRN

## 2022-11-30 NOTE — Progress Notes (Signed)
Subjective:    Patient ID: Carlos Townsend, male    DOB: 1996-04-01, 26 y.o.   MRN: [id number]  HPI  Pt in for first time.  Pt states he feels like he is not digesting food well. He has some epigasric and ruq pain. Pt has had pain for 5 months. Rare alcohol use. He does admit eating fried foods. One sprite a day. Pt smokes marijuana 3 times a day. Pt states in past he had consecutive days of vomiting in past around summer. Pt states back in September he was given omeprazole and reglan. He states neither helped.   Note first time had sgnficant pain was in August while on vacation.  Pt has not pain except when he eats.  He had subjective fever for few days in August.  One loose stool today but no others.   Pt has considered going to GI MD.  In summer ED visit. Reviewed that note today.  On discussion no ppi and no h2 blocker. No hx of h pylori.  Review of Systems  Constitutional:  Negative for chills, fatigue and fever.  HENT:  Negative for congestion.   Respiratory:  Negative for cough, chest tightness, shortness of breath and wheezing.   Cardiovascular:  Negative for chest pain and palpitations.  Gastrointestinal:  Positive for abdominal pain. Negative for blood in stool, constipation, diarrhea and nausea.  Genitourinary:  Negative for dysuria.  Musculoskeletal:  Negative for back pain and joint swelling.  Neurological:  Negative for dizziness, seizures, weakness and light-headedness.  Hematological:  Negative for adenopathy. Does not bruise/bleed easily.  Psychiatric/Behavioral:  Negative for behavioral problems.     No past medical history on file.   Social History   Socioeconomic History   Marital status: Single    Spouse name: Not on file   Number of children: Not on file   Years of education: Not on file   Highest education level: Not on file  Occupational History   Not on file  Tobacco Use   Smoking status: Never    Passive exposure: Yes   Smokeless tobacco:  Never  Vaping Use   Vaping Use: Never used  Substance and Sexual Activity   Alcohol use: Yes    Comment: social   Drug use: Yes    Types: Marijuana   Sexual activity: Yes    Birth control/protection: Condom  Other Topics Concern   Not on file  Social History Narrative   Not on file   Social Determinants of Health   Financial Resource Strain: Not on file  Food Insecurity: Not on file  Transportation Needs: Not on file  Physical Activity: Not on file  Stress: Not on file  Social Connections: Not on file  Intimate Partner Violence: Not on file    Past Surgical History:  Procedure Laterality Date   ANKLE FRACTURE SURGERY     SHOULDER SURGERY      Family History  Problem Relation Age of Onset   Hyperlipidemia Father    Hypertension Father    Diabetes Neg Hx    Heart attack Neg Hx    Sudden death Neg Hx     No Known Allergies  Current Outpatient Medications on File Prior to Visit  Medication Sig Dispense Refill   chlorproMAZINE (THORAZINE) 25 MG tablet Take 1 tablet (25 mg total) by mouth every 6 (six) hours as needed for hiccoughs. 10 tablet 0   cyclobenzaprine (FLEXERIL) 10 MG tablet Take 1 tablet (10 mg total) by mouth  3 (three) times daily as needed for muscle spasms. 20 tablet 0   diphenhydrAMINE (BENADRYL) 25 mg capsule Take 1 capsule (25 mg total) by mouth every 6 (six) hours as needed. 30 capsule 0   metoCLOPramide (REGLAN) 10 MG tablet Take 1 tablet (10 mg total) by mouth every 6 (six) hours. 30 tablet 0   omeprazole (PRILOSEC) 20 MG capsule Take 1 capsule (20 mg total) by mouth daily. 14 capsule 0   No current facility-administered medications on file prior to visit.    BP 104/68   Pulse 86   Temp 98 F (36.7 C)   Resp 18   Ht 5\' 9"  (1.753 m)   Wt 174 lb (78.9 kg)   SpO2 98%   BMI 25.70 kg/m        Objective:   Physical Exam  General Mental Status- Alert. General Appearance- Not in acute distress.   Skin General: Color- Normal Color.  Moisture- Normal Moisture.  Neck Carotid Arteries- Normal color. Moisture- Normal Moisture. No carotid bruits. No JVD.  Chest and Lung Exam Auscultation: Breath Sounds:-Normal.  Cardiovascular Auscultation:Rythm- Regular. Murmurs & Other Heart Sounds:Auscultation of the heart reveals- No Murmurs.  Abdomen Inspection:-Inspeection Normal. Palpation/Percussion:Note:No mass. Palpation and Percussion of the abdomen reveal- faint epigasric and ruq Tender, Non Distended + BS, no rebound or guarding.    Neurologic Cranial Nerve exam:- CN III-XII intact(No nystagmus), symmetric smile. Strength:- 5/5 equal and symmetric strength both upper and lower extremities.      Assessment & Plan:   Patient Instructions  For 5 months of ruq associated with some epigastric pain after eating will get cbc, cmp, lipase and h pylori igG lab. Also will get RUQ to evaluate liver and GB.  Recommend bland healthy diet, avoid alcohol and try to stop marijuana. Have seen cannabis hyperemesis syndrome.   Refer to GI MD after lab and imaging reviewed. Recommend you call GI office.  If pending above severe worsening pain be seen in the ED.  Follow up 10 days or sooner if needed.   [place], PA-C

## 2022-11-30 NOTE — Patient Instructions (Addendum)
For 5 months of ruq associated with some epigastric pain after eating will get cbc, cmp, lipase and h pylori igG lab. Also will get RUQ [place] to evaluate liver and GB.  Recommend bland healthy diet, avoid alcohol and try to stop marijuana. Have seen cannabis hyperemesis syndrome.   Refer to GI MD after lab and imaging reviewed. Recommend you call GI office.  If pending above severe worsening pain be seen in the ED.  Follow up 10 days or sooner if needed.

## 2022-12-01 NOTE — Addendum Note (Signed)
Addended by: Gwenevere Abbot on: 12/01/2022 06:43 AM   Modules accepted: Orders

## 2022-12-02 ENCOUNTER — Encounter: Payer: Self-pay | Admitting: Medical

## 2022-12-10 ENCOUNTER — Ambulatory Visit (INDEPENDENT_AMBULATORY_CARE_PROVIDER_SITE_OTHER): Payer: Commercial Managed Care - HMO | Admitting: Medical

## 2022-12-10 VITALS — BP 118/70 | HR 79 | Temp 98.0°F | Resp 18 | Ht 69.0 in | Wt 178.0 lb

## 2022-12-10 DIAGNOSIS — M542 Cervicalgia: Secondary | ICD-10-CM

## 2022-12-10 DIAGNOSIS — R112 Nausea with vomiting, unspecified: Secondary | ICD-10-CM

## 2022-12-10 DIAGNOSIS — M25511 Pain in right shoulder: Secondary | ICD-10-CM | POA: Diagnosis not present

## 2022-12-10 DIAGNOSIS — G8929 Other chronic pain: Secondary | ICD-10-CM

## 2022-12-10 NOTE — Progress Notes (Signed)
Subjective:    Patient ID: Carlos Townsend, male    DOB: [birth year], 26 y.o.   MRN: [id number]  HPI Pt in for follow up.  He has appt with GI MD 12-14-2022.   Last visit A/P below.  "For 5 months of ruq associated with some epigastric pain after eating will get cbc, cmp, lipase and h pylori igG lab. Also will get RUQ [place] to evaluate liver and GB.   Recommend bland healthy diet, avoid alcohol and try to stop marijuana. Have seen cannabis hyperemesis syndrome.    Refer to GI MD after lab and imaging reviewed. Recommend you call GI office.   If pending above severe worsening pain be seen in the ED."  Labs were normal except mild lipase decreased.  US abdomen  IMPRESSION: No cholelithiasis or gallbladder wall thickening is noted. But positive sonographic Murphy's sign was reported by the sonographer. No other abnormality seen in the right upper quadrant of the abdomen.     Pt states he has been on perocet for about one year. He states he still has neck and shoulder pain for which he was originally prescribed percocet.  Pt states he was getting percocet from family member and was not a prescription. 7.5 or 10 mg every 4-8 hours. He has been on percocet for one year. He states has pain in neck and shoulder since 2014. 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  Pt states shoulder surgery in 2020.    Pt notes that since October and December when he would try to sporadically stop and noticed correlation on vomiting. Recently he restarted and noted stomach pain,nausea and vomiting resolved.   Pt states recently on Monday he started back taking percocet regularly and his gi symptoms resolved.   Review of Systems  Respiratory:  Negative for cough, chest tightness and shortness of breath.   Cardiovascular:  Negative for chest pain and palpitations.  Gastrointestinal:  Positive for nausea and vomiting. Negative for abdominal pain.       Some loose  stools.  Musculoskeletal:  Positive for neck pain. Negative for back pain.       Shoulder pain  Skin:  Negative for rash.  Neurological:  Negative for dizziness, seizures, syncope and facial asymmetry.  Hematological:  Negative for adenopathy. Does not bruise/bleed easily.  Psychiatric/Behavioral:  Negative for behavioral problems and confusion.    No past medical history on file.   Social History   Socioeconomic History   Marital status: Single    Spouse name: Not on file   Number of children: Not on file   Years of education: Not on file   Highest education level: Not on file  Occupational History   Not on file  Tobacco Use   Smoking status: Never    Passive exposure: Yes   Smokeless tobacco: Never  Vaping Use   Vaping Use: Never used  Substance and Sexual Activity   Alcohol use: Yes    Comment: social   Drug use: Yes    Types: Marijuana    Comment: 3 joints a day.   Sexual activity: Yes    Birth control/protection: Condom  Other Topics Concern   Not on file  Social History Narrative   Not on file   Social Determinants of Health   Financial Resource Strain: Not on file  Food Insecurity: Not on file  Transportation Needs: Not on file  Physical Activity: Not on file  Stress: Not on  file  Social Connections: Not on file  Intimate Partner Violence: Not on file    Past Surgical History:  Procedure Laterality Date   ANKLE FRACTURE SURGERY     SHOULDER SURGERY      Family History  Problem Relation Age of Onset   Hyperlipidemia Father    Hypertension Father    Diabetes Neg Hx    Heart attack Neg Hx    Sudden death Neg Hx     No Known Allergies  Current Outpatient Medications on File Prior to Visit  Medication Sig Dispense Refill   chlorproMAZINE (THORAZINE) 25 MG tablet Take 1 tablet (25 mg total) by mouth every 6 (six) hours as needed for hiccoughs. 10 tablet 0   cyclobenzaprine (FLEXERIL) 10 MG tablet Take 1 tablet (10 mg total) by mouth 3 (three)  times daily as needed for muscle spasms. 20 tablet 0   diphenhydrAMINE (BENADRYL) 25 mg capsule Take 1 capsule (25 mg total) by mouth every 6 (six) hours as needed. 30 capsule 0   famotidine (PEPCID) 20 MG tablet Take 1 tablet (20 mg total) by mouth daily. 30 tablet 0   metoCLOPramide (REGLAN) 10 MG tablet Take 1 tablet (10 mg total) by mouth every 6 (six) hours. 30 tablet 0   omeprazole (PRILOSEC) 20 MG capsule Take 1 capsule (20 mg total) by mouth daily. 14 capsule 0   ondansetron (ZOFRAN) 4 MG tablet Take 1 tablet (4 mg total) by mouth every 8 (eight) hours as needed for nausea or vomiting. 20 tablet 0   No current facility-administered medications on file prior to visit.    BP 118/70   Pulse 79   Temp 98 F (36.7 C)   Resp 18   Ht 5\' 9"  (1.753 m)   Wt 178 lb (80.7 kg)   SpO2 97%   BMI 26.29 kg/m        Objective:   Physical Exam  Physical Exam   General Mental Status- Alert. General Appearance- Not in acute distress.    Skin General: Color- Normal Color. Moisture- Normal Moisture.   Neck Carotid Arteries- Normal color. Moisture- Normal Moisture. No carotid bruits. No JVD.   Chest and Lung Exam Auscultation: Breath Sounds:-Normal.   Cardiovascular Auscultation:Rythm- Regular. Murmurs & Other Heart Sounds:Auscultation of the heart reveals- No Murmurs.   Abdomen Inspection:-Inspeection Normal. Palpation/Percussion:Note:No mass. Palpation and Percussion of the abdomen reveal- faint epigasric and ruq Tender, Non Distended + BS, no rebound or guarding.     Neurologic Cranial Nerve exam:- CN III-XII intact(No nystagmus), symmetric smile. Strength:- 5/5 equal and symmetric strength both upper and lower extremities.       Assessment & Plan:   Patient Instructions  Persisting intermittent epigastric pain, nausea and vomiting.  Glad to hear that you have appointment with GI MD.  Would recommend keeping that appointment.  Since last visit you brought up the fact  that you have been using Percocet over the past year and noticed recently that epigastric pain, nausea vomiting and occasional loose stools occur when you stopped the Percocet.  Recently when you restarted you noticed all symptoms resolved.  So probable/possible withdrawal from opioids.  Persisting high level neck and shoulder pain.  8 out of 10 without medication.  Would recommend that you at most Percocet 7.5 mg  every 6-8 hours as needed severe pain.  I went ahead and placed referral to pain management.  Also want you to go ahead and get scheduled back with the orthopedist.  Follow-up in  2 weeks or sooner if needed.   Mackie Pai, PA-C

## 2022-12-10 NOTE — Patient Instructions (Signed)
Persisting intermittent epigastric pain, nausea and vomiting.  Glad to hear that you have appointment with GI MD.  Would recommend keeping that appointment.  Since last visit you brought up the fact that you have been using Percocet over the past year and noticed recently that epigastric pain, nausea vomiting and occasional loose stools occur when you stopped the Percocet.  Recently when you restarted you noticed all symptoms resolved.  So probable/possible withdrawal from opioids.  Persisting high level neck and shoulder pain.  8 out of 10 without medication.  Would recommend that you at most Percocet 7.5 mg  every 6-8 hours as needed severe pain.  I went ahead and placed referral to pain management.  Also want you to go ahead and get scheduled back with the orthopedist.  Follow-up in 2 weeks or sooner if needed.

## 2022-12-14 ENCOUNTER — Ambulatory Visit: Payer: Commercial Managed Care - HMO | Admitting: Internal Medicine

## 2022-12-14 ENCOUNTER — Encounter: Payer: Self-pay | Admitting: Internal Medicine

## 2022-12-14 VITALS — BP 122/64 | HR 82 | Ht 69.0 in | Wt 169.4 lb

## 2022-12-14 DIAGNOSIS — F1210 Cannabis abuse, uncomplicated: Secondary | ICD-10-CM | POA: Diagnosis not present

## 2022-12-14 DIAGNOSIS — R112 Nausea with vomiting, unspecified: Secondary | ICD-10-CM | POA: Diagnosis not present

## 2022-12-14 NOTE — Progress Notes (Signed)
HISTORY OF PRESENT ILLNESS:   Carlos Townsend is a 26 y.o. male, warehouse worker at [employer] who is sent today by his PCP regarding guarding issues with recurrent nausea and vomiting that began about 6 months ago.  Patient states that he has been having trouble with his shoulder over the past year.  He tells me that he has been taking one of his relatives opioids.  He tells me that when he comes off the opioids he will have issues with nausea and vomiting.  It may be undigested food or bile.  This does not happen at night.  No blood.  No abdominal pain or weight loss.  He does smoke cannabis daily and has for some time (greater than a decade) but he does not feel that this is the cause for his issues with nausea and vomiting (it has been suggested to him by other providers).  His last such issues with nausea, vomiting or little over 2 weeks ago.  He was seen by his PCP at that time.  Reviewed.  Blood work from that day included normal comprehensive metabolic panel with normal liver test.  Nonelevated lipase.  Normal CBC.  Normal eosinophils.  Negative H. pylori antibody.  The patient also had imaging studies.  Plain abdominal films September 2023 were normal.  Right upper quadrant ultrasound November 30, 2022 was unremarkable.  No gallstones.  He has been on PPI short-term.  Discontinued this as it did not help.  Not taking metoclopramide or Pepcid as it is on his list.   REVIEW OF SYSTEMS:   All non-GI ROS negative unless otherwise stated in the HPI.   History reviewed. No pertinent past medical history.        Past Surgical History:  Procedure Laterality Date   ANKLE FRACTURE SURGERY       SHOULDER SURGERY          Social History Carlos Townsend  reports that he has never smoked. He has been exposed to tobacco smoke. He has never used smokeless tobacco. He reports current alcohol use. He reports current drug use. Drug: Marijuana.   family history includes Hyperlipidemia in his father; Hypertension  in his father.   No Known Allergies       PHYSICAL EXAMINATION: Vital signs: BP 122/64   Pulse 82   Ht 5' 9" (1.753 m)   Wt 169 lb 6 oz (76.8 kg)   BMI 25.01 kg/m   Constitutional: generally well-appearing, no acute distress Psychiatric: alert and oriented x3, cooperative Eyes: extraocular movements intact, anicteric, conjunctiva pink Mouth: oral pharynx moist, no lesions Neck: supple no lymphadenopathy Cardiovascular: heart regular rate and rhythm, no murmur Lungs: clear to auscultation bilaterally Abdomen: soft, nontender, nondistended, no obvious ascites, no peritoneal signs, normal bowel sounds, no organomegaly Rectal: Omitted Extremities: no clubbing, cyanosis, or lower extremity edema bilaterally Skin: no lesions on visible extremities Neuro: No focal deficits.  Cranial nerves intact   ASSESSMENT:   1.  Intermittent problems with nausea and vomiting as described.  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  Workup to date has been negative including extensive laboratories and x-rays as described.  No alarm features such as bleeding or weight loss.  Should investigate further with upper endoscopy and biopsies     PLAN:   1.  Discussed cannabis hyperemesis syndrome 2.  Advised to stop smoking cannabis 3.  Planning upper endoscopy with biopsies to rule out other causes for recurrent nausea and vomiting.The nature of the procedure, as well as the risks,   benefits, and alternatives were carefully and thoroughly reviewed with the patient. Ample time for discussion and questions allowed. The patient understood, was satisfied, and agreed to proceed. 4.  Resume general medical care with PCP 

## 2022-12-14 NOTE — Patient Instructions (Addendum)
_______________________________________________________  If you are age 28 or older, your body mass index should be between 23-30. Your Body mass index is 25.01 kg/m. If this is out of the aforementioned range listed, please consider follow up with your Primary Care Provider.  If you are age 29 or younger, your body mass index should be between 19-25. Your Body mass index is 25.01 kg/m. If this is out of the aformentioned range listed, please consider follow up with your Primary Care Provider.   ________________________________________________________  The  GI providers would like to encourage you to use [facility] to communicate with providers for non-urgent requests or questions.  Due to long hold times on the telephone, sending your provider a message by [facility] may be a faster and more efficient way to get a response.  Please allow 48 business hours for a response.  Please remember that this is for non-urgent requests.  _______________________________________________________  Carlos Townsend have been scheduled for an endoscopy. Please follow written instructions given to you at your visit today. If you use inhalers (even only as needed), please bring them with you on the day of your procedure.

## 2022-12-24 ENCOUNTER — Ambulatory Visit: Payer: Commercial Managed Care - HMO | Admitting: Medical

## 2023-01-03 ENCOUNTER — Ambulatory Visit (INDEPENDENT_AMBULATORY_CARE_PROVIDER_SITE_OTHER): Payer: Commercial Managed Care - HMO | Admitting: Medical

## 2023-01-03 VITALS — BP 122/66 | HR 73 | Temp 98.0°F | Resp 18 | Ht 69.0 in | Wt 174.0 lb

## 2023-01-03 DIAGNOSIS — M792 Neuralgia and neuritis, unspecified: Secondary | ICD-10-CM

## 2023-01-03 DIAGNOSIS — G8929 Other chronic pain: Secondary | ICD-10-CM

## 2023-01-03 DIAGNOSIS — M542 Cervicalgia: Secondary | ICD-10-CM

## 2023-01-03 MED ORDER — METHYLPREDNISOLONE ACETATE 40 MG/ML IJ SUSP
40.0000 mg | Freq: Once | INTRAMUSCULAR | Status: AC
  Administered 2023-01-03: 40 mg via INTRAMUSCULAR

## 2023-01-03 MED ORDER — TRAMADOL HCL 50 MG PO TABS: 50.0000 mg | ORAL_TABLET | Freq: Four times a day (QID) | ORAL | 0 refills | Status: AC | PRN

## 2023-01-03 NOTE — Progress Notes (Signed)
Subjective:    Patient ID: Carlos Townsend, male    DOB: 08-27-1996, 27 y.o.   MRN: [id number]  HPI  Pt in for follow up.  Pt has been having neck and shoulder pain. Pt thinks pain is coming from his neck.  Pt has hx of shoulder pain but he states recently more on rt side of his neck. On discussion he points to lower trapezius area.  Pt also states very frequently will have rt arm numbness with radicular pain.    Prior mri of neck showed below. IMPRESSION:   1. Mild right eccentric disc bulge at C3-4 with resultant mild right  C4 foraminal stenosis.  2. Additional mild noncompressive annular disc bulging at C4-5 and  C5-6 without stenosis or impingement.  3. Otherwise normal MRI of the cervical spine.    Hx of AC joint tear and surgery.      Pt states for past 2 weeks he has not been taking any pain medications. He was taking oxycodone from relative. He has not been taking anything recently for neck pain.  Pt states failed steroid infection in past.    Pt still having gi upset, nausea and vomiting. Pt has upcoming endoscopy. Below  is A/P from gastroenterologist.    ASSESSMENT:   1.  Intermittent problems with nausea and vomiting as described.  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  Workup to date has been negative including extensive laboratories and x-rays as described.  No alarm features such as bleeding or weight loss.  Should investigate further with upper endoscopy and biopsies     PLAN:   1.  Discussed cannabis hyperemesis syndrome 2.  Advised to stop smoking cannabis 3.  Planning upper endoscopy with biopsies to rule out other causes for recurrent nausea and vomiting.The nature of the procedure, as well as the risks, benefits, and alternatives were carefully and thoroughly reviewed with the patient. Ample time for discussion and questions allowed. The patient understood, was satisfied, and agreed to proceed. 4.  Resume general medical care with  PCP    Review of Systems  Constitutional:  Negative for chills, fatigue and fever.  HENT:  Negative for congestion, drooling and ear discharge.   Respiratory:  Negative for chest tightness, shortness of breath and wheezing.   Cardiovascular:  Negative for chest pain and palpitations.  Musculoskeletal:  Positive for neck pain.  Skin:  Negative for rash.  Neurological:  Negative for dizziness, numbness and headaches.  Hematological:  Negative for adenopathy. Does not bruise/bleed easily.  Psychiatric/Behavioral:  Negative for behavioral problems and decreased concentration.     No past medical history on file.   Social History   Socioeconomic History   Marital status: Single    Spouse name: Not on file   Number of children: Not on file   Years of education: Not on file   Highest education level: Not on file  Occupational History   Not on file  Tobacco Use   Smoking status: Never    Passive exposure: Yes   Smokeless tobacco: Never  Vaping Use   Vaping Use: Never used  Substance and Sexual Activity   Alcohol use: Yes    Comment: social   Drug use: Yes    Types: Marijuana    Comment: 3 joints a day.   Sexual activity: Yes    Birth control/protection: Condom  Other Topics Concern   Not on file  Social History Narrative   Not on file   Social Determinants of Health  Financial Resource Strain: Not on file  Food Insecurity: Not on file  Transportation Needs: Not on file  Physical Activity: Not on file  Stress: Not on file  Social Connections: Not on file  Intimate Partner Violence: Not on file    Past Surgical History:  Procedure Laterality Date   ANKLE FRACTURE SURGERY     SHOULDER SURGERY      Family History  Problem Relation Age of Onset   Hyperlipidemia Father    Hypertension Father    Diabetes Neg Hx    Heart attack Neg Hx    Sudden death Neg Hx     No Known Allergies  Current Outpatient Medications on File Prior to Visit  Medication Sig  Dispense Refill   chlorproMAZINE (THORAZINE) 25 MG tablet Take 1 tablet (25 mg total) by mouth every 6 (six) hours as needed for hiccoughs. 10 tablet 0   cyclobenzaprine (FLEXERIL) 10 MG tablet Take 1 tablet (10 mg total) by mouth 3 (three) times daily as needed for muscle spasms. 20 tablet 0   diphenhydrAMINE (BENADRYL) 25 mg capsule Take 1 capsule (25 mg total) by mouth every 6 (six) hours as needed. 30 capsule 0   famotidine (PEPCID) 20 MG tablet Take 1 tablet (20 mg total) by mouth daily. 30 tablet 0   metoCLOPramide (REGLAN) 10 MG tablet Take 1 tablet (10 mg total) by mouth every 6 (six) hours. 30 tablet 0   omeprazole (PRILOSEC) 20 MG capsule Take 1 capsule (20 mg total) by mouth daily. 14 capsule 0   ondansetron (ZOFRAN) 4 MG tablet Take 1 tablet (4 mg total) by mouth every 8 (eight) hours as needed for nausea or vomiting. 20 tablet 0   No current facility-administered medications on file prior to visit.    BP 122/66   Pulse 73   Temp 98 F (36.7 C)   Resp 18   Ht 5\' 9"  (1.753 m)   Wt 174 lb (78.9 kg)   SpO2 100%   BMI 25.70 kg/m        Objective:   Physical Exam  General Mental Status- Alert. General Appearance- Not in acute distress.   Skin General: Color- Normal Color. Moisture- Normal Moisture.  Neck Rt sided neck pain on palpation.  Rt shoulder- pain on abduction in posterior shoulder.   Chest and Lung Exam Auscultation: Breath Sounds:-Normal.  Cardiovascular Auscultation:Rythm- Regular. Murmurs & Other Heart Sounds:Auscultation of the heart reveals- No Murmurs.  Abdomen Inspection:-Inspeection Normal. Palpation/Percussion:Note:No mass. Palpation and Percussion of the abdomen reveal- Non Tender, Non Distended + BS, no rebound or guarding.  Neurologic Cranial Nerve exam:- CN III-XII intact(No nystagmus), symmetric smile. Strength:- 5/5 equal and symmetric strength both upper and lower extremities.       Assessment & Plan:   Neck pain rt side with  some radicular pain and rt shoulder pain.   Based on above and mri findings referring to neurosurgeon. I put in referral to [facility]. Number is [phone]. You might try to see Dr Ellene Route of one of his partners.  Will give depomedrol 40 mg IM today and tramadol short course to see if it helps. Rx advisement not to use any other narcotic.  Will avoid oral nsaids or steroid due to GI Issues.  Follow up 2 weeks or sooner if needed.   Mackie Pai, PA-C

## 2023-01-03 NOTE — Addendum Note (Signed)
Addended by: Jeronimo Greaves on: 01/03/2023 01:42 PM   Modules accepted: Orders

## 2023-01-03 NOTE — Patient Instructions (Addendum)
Neck pain rt side with some radicular pain and rt shoulder pain.   Based on above and mri findings referring to neurosurgeon. I put in referral to [facility]. Number is [phone]. You might try to see Dr Ellene Route of one of his partners.  Will give depomedrol 40 mg IM today and tramadol short course to see if it helps. Rx advisement not to use any other narcotic.  Will avoid oral nsaids or steroid due to GI Issues.  Follow up 2 weeks or sooner if needed.

## 2023-01-07 ENCOUNTER — Ambulatory Visit (AMBULATORY_SURGERY_CENTER): Payer: Commercial Managed Care - HMO | Admitting: Internal Medicine

## 2023-01-07 ENCOUNTER — Encounter: Payer: Self-pay | Admitting: Internal Medicine

## 2023-01-07 VITALS — BP 117/72 | HR 66 | Temp 98.2°F | Resp 16 | Ht 69.0 in | Wt 169.0 lb

## 2023-01-07 DIAGNOSIS — K2950 Unspecified chronic gastritis without bleeding: Secondary | ICD-10-CM | POA: Diagnosis not present

## 2023-01-07 DIAGNOSIS — R112 Nausea with vomiting, unspecified: Secondary | ICD-10-CM

## 2023-01-07 MED ORDER — SODIUM CHLORIDE 0.9 % IV SOLN: 500.0000 mL | INTRAVENOUS | Status: DC

## 2023-01-07 NOTE — Progress Notes (Signed)
Called to room to assist during endoscopic procedure.  Patient ID and intended procedure confirmed with present staff. Received instructions for my participation in the procedure from the performing physician.

## 2023-01-07 NOTE — Patient Instructions (Signed)
Thank you for coming in to see [place] today. Resume your normal diet today. Return to regular daily activities tomorrow.      YOU HAD AN ENDOSCOPIC PROCEDURE TODAY AT [facility]:   Refer to the procedure report that was given to you for any specific questions about what was found during the examination.  If the procedure report does not answer your questions, please call your gastroenterologist to clarify.  If you requested that your care partner not be given the details of your procedure findings, then the procedure report has been included in a sealed envelope for you to review at your convenience later.  YOU SHOULD EXPECT: Some feelings of bloating in the abdomen. Passage of more gas than usual.  Walking can help get rid of the air that was put into your GI tract during the procedure and reduce the bloating. If you had a lower endoscopy (such as a colonoscopy or flexible sigmoidoscopy) you may notice spotting of blood in your stool or on the toilet paper. If you underwent a bowel prep for your procedure, you may not have a normal bowel movement for a few days.  Please Note:  You might notice some irritation and congestion in your nose or some drainage.  This is from the oxygen used during your procedure.  There is no need for concern and it should clear up in a day or so.  SYMPTOMS TO REPORT IMMEDIATELY:    Following upper endoscopy (EGD)  Vomiting of blood or coffee ground material  New chest pain or pain under the shoulder blades  Painful or persistently difficult swallowing  New shortness of breath  Fever of 100F or higher  Black, tarry-looking stools  For urgent or emergent issues, a gastroenterologist can be reached at any hour by calling [phone]. Do not use MyChart messaging for urgent concerns.    DIET:  We do recommend a small meal at first, but then you may proceed to your regular diet.  Drink plenty of fluids but you should avoid alcoholic beverages  for 24 hours.  ACTIVITY:  You should plan to take it easy for the rest of today and you should NOT DRIVE or use heavy machinery until tomorrow (because of the sedation medicines used during the test).    FOLLOW UP: Our staff will call the number listed on your records the next business day following your procedure.  We will call around 7:15- 8:00 am to check on you and address any questions or concerns that you may have regarding the information given to you following your procedure. If we do not reach you, we will leave a message.     If any biopsies were taken you will be contacted by phone or by letter within the next 1-3 weeks.  Please call us at [phone] if you have not heard about the biopsies in 3 weeks.    SIGNATURES/CONFIDENTIALITY: You and/or your care partner have signed paperwork which will be entered into your electronic medical record.  These signatures attest to the fact that that the information above on your After Visit Summary has been reviewed and is understood.  Full responsibility of the confidentiality of this discharge information lies with you and/or your care-partner.

## 2023-01-07 NOTE — Progress Notes (Signed)
HISTORY OF PRESENT ILLNESS:   Carlos Townsend is a 27 y.o. male, Biochemist, clinical at [employer] who is sent today by his PCP regarding guarding issues with recurrent nausea and vomiting that began about 6 months ago.  Patient states that he has been having trouble with his shoulder over the past year.  He tells me that he has been taking one of his relatives opioids.  He tells me that when he comes off the opioids he will have issues with nausea and vomiting.  It may be undigested food or bile.  This does not happen at night.  No blood.  No abdominal pain or weight loss.  He does smoke cannabis daily and has for some time (greater than a decade) but he does not feel that this is the cause for his issues with nausea and vomiting (it has been suggested to him by other providers).  His last such issues with nausea, vomiting or little over 2 weeks ago.  He was seen by his PCP at that time.  Reviewed.  Blood work from that day included normal comprehensive metabolic panel with normal liver test.  Nonelevated lipase.  Normal CBC.  Normal eosinophils.  Negative H. pylori antibody.  The patient also had imaging studies.  Plain abdominal films September 2023 were normal.  Right upper quadrant ultrasound November 30, 2022 was unremarkable.  No gallstones.  He has been on PPI short-term.  Discontinued this as it did not help.  Not taking metoclopramide or Pepcid as it is on his list.   REVIEW OF SYSTEMS:   All non-GI ROS negative unless otherwise stated in the HPI.   History reviewed. No pertinent past medical history.        Past Surgical History:  Procedure Laterality Date   ANKLE FRACTURE SURGERY       SHOULDER SURGERY          Social History Carlos Townsend  reports that he has never smoked. He has been exposed to tobacco smoke. He has never used smokeless tobacco. He reports current alcohol use. He reports current drug use. Drug: Marijuana.   family history includes Hyperlipidemia in his father; Hypertension  in his father.   No Known Allergies       PHYSICAL EXAMINATION: Vital signs: BP 122/64   Pulse 82   Ht 5\' 9"  (1.753 m)   Wt 169 lb 6 oz (76.8 kg)   BMI 25.01 kg/m   Constitutional: generally well-appearing, no acute distress Psychiatric: alert and oriented x3, cooperative Eyes: extraocular movements intact, anicteric, conjunctiva pink Mouth: oral pharynx moist, no lesions Neck: supple no lymphadenopathy Cardiovascular: heart regular rate and rhythm, no murmur Lungs: clear to auscultation bilaterally Abdomen: soft, nontender, nondistended, no obvious ascites, no peritoneal signs, normal bowel sounds, no organomegaly Rectal: Omitted Extremities: no clubbing, cyanosis, or lower extremity edema bilaterally Skin: no lesions on visible extremities Neuro: No focal deficits.  Cranial nerves intact   ASSESSMENT:   1.  Intermittent problems with nausea and vomiting as described.  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  Workup to date has been negative including extensive laboratories and x-rays as described.  No alarm features such as bleeding or weight loss.  Should investigate further with upper endoscopy and biopsies     PLAN:   1.  Discussed cannabis hyperemesis syndrome 2.  Advised to stop smoking cannabis 3.  Planning upper endoscopy with biopsies to rule out other causes for recurrent nausea and vomiting.The nature of the procedure, as well as the risks,  benefits, and alternatives were carefully and thoroughly reviewed with the patient. Ample time for discussion and questions allowed. The patient understood, was satisfied, and agreed to proceed. 4.  Resume general medical care with PCP

## 2023-01-07 NOTE — Progress Notes (Signed)
Sedate, gd SR, tolerated procedure well, VSS, report to RN 

## 2023-01-07 NOTE — Op Note (Signed)
[facility] Patient Name: Carlos Townsend Procedure Date: 01/07/2023 3:59 PM MRN: [id number] Endoscopist: Docia Chuck. Henrene Pastor , MD, [id number] Age: 27 Referring MD:  Date of Birth: 26-Sep-1996 Gender: Male Account #: [id number] Procedure:                Upper GI endoscopy with biopsies Indications:              Nausea with vomiting Medicines:                Monitored Anesthesia Care Procedure:                Pre-Anesthesia Assessment:                           - Prior to the procedure, a History and Physical                            was performed, and patient medications and                            allergies were reviewed. The patient's tolerance of                            previous anesthesia was also reviewed. The risks                            and benefits of the procedure and the sedation                            options and risks were discussed with the patient.                            All questions were answered, and informed consent                            was obtained. Prior Anticoagulants: The patient has                            taken no anticoagulant or antiplatelet agents. ASA                            Grade Assessment: I - A normal, healthy patient.                            After reviewing the risks and benefits, the patient                            was deemed in satisfactory condition to undergo the                            procedure.                           After obtaining informed consent, the endoscope was  passed under direct vision. Throughout the                            procedure, the patient's blood pressure, pulse, and                            oxygen saturations were monitored continuously. The                            Endoscope was introduced through the mouth, and                            advanced to the second part of duodenum. The upper                            GI endoscopy was accomplished  without difficulty.                            The patient tolerated the procedure well. Scope In: Scope Out: Findings:                 The esophagus was normal.                           The stomach was normal. Biopsies were taken with a                            cold forceps for histology.                           The examined duodenum was normal.                           The cardia and gastric fundus were normal on                            retroflexion. Complications:            No immediate complications. Estimated Blood Loss:     Estimated blood loss: none. Impression:               1. Normal EGD                           2. Status post gastric biopsies                           3. 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 Recommendation:           - Patient has a contact number available for                            emergencies. The signs and symptoms of potential                            delayed complications were discussed with the  patient. Return to normal activities tomorrow.                            Written discharge instructions were provided to the                            patient.                           - Resume previous diet.                           - Continue present medications.                           - Await pathology results. We will communicate the                            results after they are available.                           - Recommend cessation of cannabis use                           -The care of your primary provider Wilhemina Bonito. Marina Goodell, MD 01/07/2023 4:24:36 PM This report has been signed electronically.

## 2023-01-10 ENCOUNTER — Telehealth: Payer: Self-pay

## 2023-01-10 NOTE — Telephone Encounter (Signed)
  Follow up Call-     01/07/2023    3:26 PM  Call back number  Post procedure Call Back phone  # [phone]  Permission to leave phone message Yes    Post op call attempted, no answer, no VM.

## 2023-01-12 ENCOUNTER — Encounter: Payer: Self-pay | Admitting: Internal Medicine

## 2023-02-17 ENCOUNTER — Encounter (HOSPITAL_COMMUNITY): Payer: Self-pay

## 2023-02-17 ENCOUNTER — Ambulatory Visit (HOSPITAL_COMMUNITY)
Admission: EM | Admit: 2023-02-17 | Discharge: 2023-02-17 | Disposition: A | Discharge status: Home / Self Care | Payer: Commercial Managed Care - HMO | Attending: Family Medicine | Admitting: Family Medicine

## 2023-02-17 DIAGNOSIS — R112 Nausea with vomiting, unspecified: Secondary | ICD-10-CM | POA: Diagnosis not present

## 2023-02-17 DIAGNOSIS — R1013 Epigastric pain: Secondary | ICD-10-CM

## 2023-02-17 DIAGNOSIS — K259 Gastric ulcer, unspecified as acute or chronic, without hemorrhage or perforation: Secondary | ICD-10-CM | POA: Diagnosis not present

## 2023-02-17 HISTORY — DX: Gastric ulcer, unspecified as acute or chronic, without hemorrhage or perforation: K25.9

## 2023-02-17 MED ORDER — ALUM & MAG HYDROXIDE-SIMETH 200-200-20 MG/5ML PO SUSP
30.0000 mL | Freq: Once | ORAL | Status: AC
  Administered 2023-02-17: 30 mL via ORAL

## 2023-02-17 MED ORDER — ONDANSETRON HCL 4 MG/2ML IJ SOLN
INTRAMUSCULAR | Status: AC
  Filled 2023-02-17: qty 2

## 2023-02-17 MED ORDER — ONDANSETRON HCL 4 MG/2ML IJ SOLN
4.0000 mg | Freq: Once | INTRAMUSCULAR | Status: AC
  Administered 2023-02-17: 4 mg via INTRAMUSCULAR

## 2023-02-17 MED ORDER — OMEPRAZOLE 40 MG PO CPDR: 40.0000 mg | DELAYED_RELEASE_CAPSULE | Freq: Every day | ORAL | 0 refills | Status: DC

## 2023-02-17 MED ORDER — LIDOCAINE VISCOUS HCL 2 % MT SOLN
15.0000 mL | Freq: Once | OROMUCOSAL | Status: AC
  Administered 2023-02-17: 15 mL via OROMUCOSAL

## 2023-02-17 MED ORDER — METOCLOPRAMIDE HCL 10 MG PO TABS: 10.0000 mg | ORAL_TABLET | Freq: Four times a day (QID) | ORAL | 0 refills | Status: DC

## 2023-02-17 MED ORDER — ALUM & MAG HYDROXIDE-SIMETH 200-200-20 MG/5ML PO SUSP
ORAL | Status: AC
  Filled 2023-02-17: qty 30

## 2023-02-17 MED ORDER — LIDOCAINE VISCOUS HCL 2 % MT SOLN
OROMUCOSAL | Status: AC
  Filled 2023-02-17: qty 15

## 2023-02-17 NOTE — ED Triage Notes (Signed)
Patient c/o mid upper abdominal pain x 3 days. Patient also c/o N/V.  Patient reports a history of "stomach ulcers"  Patient states he took anti acid medications which did not help.

## 2023-02-17 NOTE — Discharge Instructions (Signed)
You were seen today for epigastric pain and vomiting, likely due to an ulcer.  I have given you medication to help calm the stomach and help with vomiting.  I have refilled the metoclopramide and omeprazole today.   You may continue the zofran you have at home for vomiting.  When you are out of the '40mg'$  omeprazole then please get over the counter omeprazole '20mg'$  to take daily, indefinitely, while you are taking tramadol.  Please follow up with GI if you continue with symptoms despite treatment.

## 2023-02-17 NOTE — ED Provider Notes (Signed)
[facility]    CSN: [id number] Arrival date & time: 02/17/23  [id number]      History   Chief Complaint Chief Complaint  Patient presents with   Abdominal Pain    HPI Carlos Townsend is a 27 y.o. male.   Last fall he was having some abdominal pain.  Dx with ulcer and given medication.  Symptoms returned and saw GI.  He has had an EGD.  He restarted with symptoms 3 days ago . Having n/v, and upper/mid abdominal.   No blood or coffee ground emesis.  No diarrhea.  No blood in his stool.  No fevers/chills.  He did try famotidine without any help.  He did take some zofran but he vomited that back up.   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  He was on percocet, but that has been stopped and on  suboxone.        Past Medical History:  Diagnosis Date   Stomach ulcer     Patient Active Problem List   Diagnosis Date Noted   Injury of third finger, left 08/04/2012    Past Surgical History:  Procedure Laterality Date   ANKLE FRACTURE SURGERY     SHOULDER SURGERY         Home Medications    Prior to Admission medications   Medication Sig Start Date End Date Taking? Authorizing Provider  chlorproMAZINE (THORAZINE) 25 MG tablet Take 1 tablet (25 mg total) by mouth every 6 (six) hours as needed for hiccoughs. [id number]   Delora Fuel, MD  cyclobenzaprine (FLEXERIL) 10 MG tablet Take 1 tablet (10 mg total) by mouth 3 (three) times daily as needed for muscle spasms. 12/06/20   Lucrezia Starch, MD  diphenhydrAMINE (BENADRYL) 25 mg capsule Take 1 capsule (25 mg total) by mouth every 6 (six) hours as needed. Patient not taking: Reported on 01/07/2023 12/22/15   Gloriann Loan, PA-C  famotidine (PEPCID) 20 MG tablet Take 1 tablet (20 mg total) by mouth daily. Patient not taking: Reported on 01/07/2023 11/30/22   Saguier, Percell Miller, PA-C  metoCLOPramide (REGLAN) 10 MG tablet Take 1 tablet (10 mg total) by mouth every 6 (six) hours. Patient not taking:  Reported on 01/07/2023 08/28/22   Blanchie Dessert, MD  omeprazole (PRILOSEC) 20 MG capsule Take 1 capsule (20 mg total) by mouth daily. Patient not taking: Reported on 01/07/2023 08/28/22   Blanchie Dessert, MD  ondansetron (ZOFRAN) 4 MG tablet Take 1 tablet (4 mg total) by mouth every 8 (eight) hours as needed for nausea or vomiting. Patient not taking: Reported on 01/07/2023 11/30/22   Saguier, Percell Miller, PA-C    Family History Family History  Problem Relation Age of Onset   Hyperlipidemia Father    Hypertension Father    Diabetes Neg Hx    Heart attack Neg Hx    Sudden death Neg Hx     Social History Social History   Tobacco Use   Smoking status: Never    Passive exposure: Yes   Smokeless tobacco: Never  Vaping Use   Vaping Use: Never used  Substance Use Topics   Alcohol use: Yes    Comment: social   Drug use: Yes    Types: Marijuana    Comment: 3 joints a day.; smoked earlier today     Allergies   Patient has no known allergies.   Review of Systems Review of Systems  Constitutional: Negative.   HENT: Negative.    Respiratory: Negative.  Cardiovascular: Negative.   Gastrointestinal:  Positive for abdominal pain, nausea and vomiting. Negative for blood in stool and diarrhea.  Musculoskeletal: Negative.   Psychiatric/Behavioral: Negative.       Physical Exam Triage Vital Signs ED Triage Vitals  Enc Vitals Group     BP 02/17/23 0821 (!) 151/80     Pulse Rate 02/17/23 0821 64     Resp 02/17/23 0821 16     Temp 02/17/23 0821 99.3 F (37.4 C)     Temp Source 02/17/23 0821 Oral     SpO2 02/17/23 0821 95 %     Weight --      Height --      Head Circumference --      Peak Flow --      Pain Score 02/17/23 0822 8     Pain Loc --      Pain Edu? --      Excl. in [place]? --    No data found.  Updated Vital Signs BP (!) 151/80 (BP Location: Right Arm)   Pulse 64   Temp 99.3 F (37.4 C) (Oral)   Resp 16   SpO2 95%   Visual Acuity Right Eye Distance:   Left  Eye Distance:   Bilateral Distance:    Right Eye Near:   Left Eye Near:    Bilateral Near:     Physical Exam Constitutional:      Appearance: He is well-developed.  Cardiovascular:     Rate and Rhythm: Normal rate.  Pulmonary:     Effort: Pulmonary effort is normal.     Breath sounds: Normal breath sounds.  Abdominal:     Tenderness: There is abdominal tenderness in the epigastric area. There is no guarding or rebound.  Skin:    General: Skin is warm.  Neurological:     General: No focal deficit present.     Mental Status: He is alert.      UC Treatments / Results  Labs (all labs ordered are listed, but only abnormal results are displayed) Labs Reviewed - No data to display  EKG   Radiology No results found.  Procedures Procedures (including critical care time)  Medications Ordered in UC Medications  alum & mag hydroxide-simeth (MAALOX/MYLANTA) 200-200-20 MG/5ML suspension 30 mL (30 mLs Oral Given 02/17/23 0843)  lidocaine (XYLOCAINE) 2 % viscous mouth solution 15 mL (15 mLs Mouth/Throat Given 02/17/23 0843)  ondansetron (ZOFRAN) injection 4 mg (4 mg Intramuscular Given 02/17/23 0840)    Initial Impression / Assessment and Plan / UC Course  I have reviewed the triage vital signs and the nursing notes.  Pertinent labs & imaging results that were available during my care of the patient were reviewed by me and considered in my medical decision making (see chart for details).   Final Clinical Impressions(s) / UC Diagnoses   Final diagnoses:  Epigastric pain  Nausea and vomiting, unspecified vomiting type  Gastric ulcer, unspecified chronicity, unspecified whether gastric ulcer hemorrhage or perforation present     Discharge Instructions      You were seen today for epigastric pain and vomiting, likely due to an ulcer.  I have given you medication to help calm the stomach and help with vomiting.  I have refilled the metoclopramide and omeprazole today.   You  may continue the zofran you have at home for vomiting.  When you are out of the '40mg'$  omeprazole then please get over the counter omeprazole '20mg'$  to take daily, indefinitely, while you  are taking tramadol.  Please follow up with GI if you continue with symptoms despite treatment.     ED Prescriptions     Medication Sig Dispense Auth. Provider   metoCLOPramide (REGLAN) 10 MG tablet Take 1 tablet (10 mg total) by mouth every 6 (six) hours. 30 tablet Adewale Pucillo, MD   omeprazole (PRILOSEC) 40 MG capsule Take 1 capsule (40 mg total) by mouth daily. 30 capsule Rondel Oh, MD      PDMP not reviewed this encounter.   Rondel Oh, MD 02/17/23 [phone]

## 2023-02-27 ENCOUNTER — Emergency Department (HOSPITAL_COMMUNITY)
Admission: EM | Admit: 2023-02-27 | Discharge: 2023-02-27 | Disposition: A | Discharge status: Home / Self Care | Payer: Commercial Managed Care - HMO | Attending: Emergency Medicine | Admitting: Emergency Medicine

## 2023-02-27 ENCOUNTER — Emergency Department (HOSPITAL_COMMUNITY): Payer: Commercial Managed Care - HMO

## 2023-02-27 ENCOUNTER — Other Ambulatory Visit (HOSPITAL_COMMUNITY): Payer: Commercial Managed Care - HMO

## 2023-02-27 DIAGNOSIS — R748 Abnormal levels of other serum enzymes: Secondary | ICD-10-CM | POA: Insufficient documentation

## 2023-02-27 DIAGNOSIS — D72829 Elevated white blood cell count, unspecified: Secondary | ICD-10-CM | POA: Diagnosis not present

## 2023-02-27 DIAGNOSIS — R1013 Epigastric pain: Secondary | ICD-10-CM

## 2023-02-27 DIAGNOSIS — K529 Noninfective gastroenteritis and colitis, unspecified: Secondary | ICD-10-CM | POA: Insufficient documentation

## 2023-02-27 DIAGNOSIS — N179 Acute kidney failure, unspecified: Secondary | ICD-10-CM | POA: Insufficient documentation

## 2023-02-27 DIAGNOSIS — R112 Nausea with vomiting, unspecified: Secondary | ICD-10-CM | POA: Diagnosis present

## 2023-02-27 LAB — COMPREHENSIVE METABOLIC PANEL
ALT: 16 U/L (ref 0–44)
AST: 21 U/L (ref 15–41)
Albumin: 4.6 g/dL (ref 3.5–5.0)
Alkaline Phosphatase: 93 U/L (ref 38–126)
Anion gap: 12 (ref 5–15)
BUN: 8 mg/dL (ref 6–20)
CO2: 24 mmol/L (ref 22–32)
Calcium: 9.9 mg/dL (ref 8.9–10.3)
Chloride: 102 mmol/L (ref 98–111)
Creatinine, Ser: 1.41 mg/dL — ABNORMAL HIGH (ref 0.61–1.24)
GFR, Estimated: >60 mL/min (ref >60)
Glucose, Bld: 110 mg/dL — ABNORMAL HIGH (ref 70–99)
Potassium: 3.7 mmol/L (ref 3.5–5.1)
Sodium: 138 mmol/L (ref 135–145)
Total Bilirubin: 1.1 mg/dL (ref 0.3–1.2)
Total Protein: 7.7 g/dL (ref 6.5–8.1)

## 2023-02-27 LAB — CBC WITH DIFFERENTIAL/PLATELET
Abs Immature Granulocytes: 0.03 10*3/uL (ref 0.00–0.07)
Basophils Absolute: 0 10*3/uL (ref 0.0–0.1)
Basophils Relative: 0 %
Eosinophils Absolute: 0.1 10*3/uL (ref 0.0–0.5)
Eosinophils Relative: 1 %
HCT: 43.7 % (ref 39.0–52.0)
Hemoglobin: 14.2 g/dL (ref 13.0–17.0)
Immature Granulocytes: 0 %
Lymphocytes Relative: 19 %
Lymphs Abs: 2.1 10*3/uL (ref 0.7–4.0)
MCH: 26.3 pg (ref 26.0–34.0)
MCHC: 32.5 g/dL (ref 30.0–36.0)
MCV: 81.1 fL (ref 80.0–100.0)
Monocytes Absolute: 0.7 10*3/uL (ref 0.1–1.0)
Monocytes Relative: 7 %
Neutro Abs: 8.1 10*3/uL — ABNORMAL HIGH (ref 1.7–7.7)
Neutrophils Relative %: 73 %
Platelets: 336 10*3/uL (ref 150–400)
RBC: 5.39 MIL/uL (ref 4.22–5.81)
RDW: 12.9 % (ref 11.5–15.5)
WBC: 11 10*3/uL — ABNORMAL HIGH (ref 4.0–10.5)
nRBC: 0 % (ref 0.0–0.2)

## 2023-02-27 LAB — URINALYSIS, ROUTINE W REFLEX MICROSCOPIC
Bilirubin Urine: NEGATIVE
Glucose, UA: NEGATIVE mg/dL
Hgb urine dipstick: NEGATIVE
Ketones, ur: 20 mg/dL — AB
Leukocytes,Ua: NEGATIVE
Nitrite: NEGATIVE
Protein, ur: NEGATIVE mg/dL
Specific Gravity, Urine: >1.046 — ABNORMAL HIGH (ref 1.005–1.030)
pH: 7 (ref 5.0–8.0)

## 2023-02-27 LAB — LIPASE, BLOOD: Lipase: 59 U/L — ABNORMAL HIGH (ref 11–51)

## 2023-02-27 MED ORDER — ONDANSETRON 8 MG PO TBDP: 8.0000 mg | ORAL_TABLET | Freq: Three times a day (TID) | ORAL | 0 refills | Status: AC | PRN

## 2023-02-27 MED ORDER — SODIUM CHLORIDE 0.9 % IV BOLUS
1000.0000 mL | Freq: Once | INTRAVENOUS | Status: AC
  Administered 2023-02-27: 1000 mL via INTRAVENOUS

## 2023-02-27 MED ORDER — DROPERIDOL 2.5 MG/ML IJ SOLN
1.2500 mg | Freq: Once | INTRAMUSCULAR | Status: AC
  Administered 2023-02-27: 1.25 mg via INTRAVENOUS
  Filled 2023-02-27: qty 2

## 2023-02-27 MED ORDER — METOCLOPRAMIDE HCL 10 MG PO TABS: 10.0000 mg | ORAL_TABLET | Freq: Four times a day (QID) | ORAL | 0 refills | Status: AC

## 2023-02-27 MED ORDER — PANTOPRAZOLE SODIUM 40 MG IV SOLR
40.0000 mg | Freq: Once | INTRAVENOUS | Status: AC
  Administered 2023-02-27: 40 mg via INTRAVENOUS
  Filled 2023-02-27: qty 10

## 2023-02-27 MED ORDER — ONDANSETRON HCL 4 MG/2ML IJ SOLN
4.0000 mg | Freq: Once | INTRAMUSCULAR | Status: AC
  Administered 2023-02-27: 4 mg via INTRAVENOUS
  Filled 2023-02-27: qty 2

## 2023-02-27 MED ORDER — IOHEXOL 350 MG/ML SOLN
75.0000 mL | Freq: Once | INTRAVENOUS | Status: AC | PRN
  Administered 2023-02-27: 75 mL via INTRAVENOUS

## 2023-02-27 MED ORDER — METOCLOPRAMIDE HCL 5 MG/ML IJ SOLN
10.0000 mg | Freq: Once | INTRAMUSCULAR | Status: AC
  Administered 2023-02-27: 10 mg via INTRAVENOUS
  Filled 2023-02-27: qty 2

## 2023-02-27 MED ORDER — OMEPRAZOLE 40 MG PO CPDR: 40.0000 mg | DELAYED_RELEASE_CAPSULE | Freq: Every day | ORAL | 0 refills | Status: DC

## 2023-02-27 NOTE — ED Notes (Signed)
Patient transported to CT 

## 2023-02-27 NOTE — Discharge Instructions (Addendum)
You are seen today in the emergency department due to nausea and vomiting.  Workup today was reassuring, there were signs of colitis which is inflammation of the colon.  Please follow-up with your gastroenterologist for follow-up.  Take the Zofran or Reglan as needed for nausea and vomiting at home.  Continue marijuana cessation, drink plenty of fluids and continue taking the Prilosec or restart if you stop taking it..  This will help reduce stomach acid - take for 8 weeks or until you see GI.  Copy of your CT scan results are attached for reference below.

## 2023-02-27 NOTE — ED Triage Notes (Signed)
Pt to ED c/o abdominal pain and vomiting that started a couple hours ago, reports hx of the same.

## 2023-02-27 NOTE — ED Provider Notes (Signed)
[place] EMERGENCY DEPARTMENT AT [facility] Provider Note   CSN: [id number] Arrival date & time: 02/27/23  1806     History  Chief Complaint  Patient presents with   Emesis   Abdominal Pain    Carlos Townsend is a 27 y.o. male.   Emesis Associated symptoms: abdominal pain   Abdominal Pain Associated symptoms: vomiting      Patient with history of previous stomach ulcers followed by gastroenterology presents to the emergency department due to nausea and vomiting.  Patient states she has been having this intermittently for the past few months, it worsened today and has been constant.  He has some epigastric pain for as well, it is constant today and does not radiate elsewhere.  He denies any hematemesis, dark tarry stool, melenic stool.  Not on any blood thinners.  He had an endoscopy a year ago which was unremarkable, does have a history of stomach upset clinical no ulcers on the endoscopy that was performed.  He does endorse alcohol use over this week, quit smoking marijuana 2 months ago.  No previous abdominal surgeries.  Patient is actively spitting into emesis bag.  Home Medications Prior to Admission medications   Medication Sig Start Date End Date Taking? Authorizing Provider  metoCLOPramide (REGLAN) 10 MG tablet Take 1 tablet (10 mg total) by mouth every 6 (six) hours. 02/27/23  Yes Theron Arista, PA-C  ondansetron (ZOFRAN-ODT) 8 MG disintegrating tablet Take 1 tablet (8 mg total) by mouth every 8 (eight) hours as needed for nausea or vomiting. 02/27/23  Yes Theron Arista, PA-C  omeprazole (PRILOSEC) 40 MG capsule Take 1 capsule (40 mg total) by mouth daily. 02/27/23   Theron Arista, PA-C  ondansetron (ZOFRAN) 4 MG tablet Take 1 tablet (4 mg total) by mouth every 8 (eight) hours as needed for nausea or vomiting. Patient not taking: Reported on 01/07/2023 11/30/22   Saguier, Ramon Dredge, PA-C      Allergies    Patient has no known allergies.    Review of Systems   Review  of Systems  Gastrointestinal:  Positive for abdominal pain and vomiting.    Physical Exam Updated Vital Signs BP (!) 154/86   Pulse 88   Temp 98.1 F (36.7 C) (Oral)   Resp 18   Ht 5\' 9"  (1.753 m)   Wt 77.1 kg   SpO2 99%   BMI 25.10 kg/m  Physical Exam Vitals and nursing note reviewed. Exam conducted with a chaperone present.  Constitutional:      Appearance: Normal appearance.  HENT:     Head: Normocephalic and atraumatic.  Eyes:     General: No scleral icterus.       Right eye: No discharge.        Left eye: No discharge.     Extraocular Movements: Extraocular movements intact.     Pupils: Pupils are equal, round, and reactive to light.  Cardiovascular:     Rate and Rhythm: Normal rate and regular rhythm.     Pulses: Normal pulses.     Heart sounds: Normal heart sounds.     No friction rub. No gallop.  Pulmonary:     Effort: Pulmonary effort is normal. No respiratory distress.     Breath sounds: Normal breath sounds.  Abdominal:     General: Abdomen is flat. Bowel sounds are normal. There is no distension.     Palpations: Abdomen is soft.     Tenderness: There is generalized abdominal tenderness.  Skin:  General: Skin is warm and dry.     Coloration: Skin is not jaundiced.  Neurological:     Mental Status: He is alert. Mental status is at baseline.     Coordination: Coordination normal.     ED Results / Procedures / Treatments   Labs (all labs ordered are listed, but only abnormal results are displayed) Labs Reviewed  CBC WITH DIFFERENTIAL/PLATELET - Abnormal; Notable for the following components:      Result Value   WBC 11.0 (*)    Neutro Abs 8.1 (*)    All other components within normal limits  COMPREHENSIVE METABOLIC PANEL - Abnormal; Notable for the following components:   Glucose, Bld 110 (*)    Creatinine, Ser 1.41 (*)    All other components within normal limits  LIPASE, BLOOD - Abnormal; Notable for the following components:   Lipase 59 (*)     All other components within normal limits  URINALYSIS, ROUTINE W REFLEX MICROSCOPIC - Abnormal; Notable for the following components:   Specific Gravity, Urine >1.046 (*)    Ketones, ur 20 (*)    All other components within normal limits    EKG None  Radiology CT Abdomen Pelvis W Contrast  Result Date: 02/27/2023 CLINICAL DATA:  Acute onset abdominal pain and vomiting EXAM: CT ABDOMEN AND PELVIS WITH CONTRAST TECHNIQUE: Multidetector CT imaging of the abdomen and pelvis was performed using the standard protocol following bolus administration of intravenous contrast. RADIATION DOSE REDUCTION: This exam was performed according to the departmental dose-optimization program which includes automated exposure control, adjustment of the mA and/or kV according to patient size and/or use of iterative reconstruction technique. CONTRAST:  75mL OMNIPAQUE IOHEXOL 350 MG/ML SOLN COMPARISON:  None Available. FINDINGS: Lower chest: No focal consolidation or pulmonary nodule in the lung bases. No pleural effusion or pneumothorax demonstrated. Partially imaged heart size is normal. Hepatobiliary: No focal hepatic lesions. No intra or extrahepatic biliary ductal dilation. Normal gallbladder. Pancreas: No focal lesions or main ductal dilation. Spleen: Normal in size without focal abnormality. Adrenals/Urinary Tract: No adrenal nodules. No suspicious renal mass, calculi or hydronephrosis. No focal bladder wall thickening. Stomach/Bowel: Normal appearance of the stomach. No abnormal bowel distention. Mild mural thickening of the ascending and transverse colon. Normal appendix. Vascular/Lymphatic: No significant vascular findings are present. No enlarged abdominal or pelvic lymph nodes. Reproductive: Prostate is unremarkable. Other: No free fluid, fluid collection, or free air. Musculoskeletal: No acute or abnormal lytic or blastic osseous lesions. IMPRESSION: 1. Mild mural thickening of the ascending and transverse colon,  suggestive of colitis. 2. No CT finding of acute pancreatitis. Electronically Signed   By: Agustin Cree M.D.   On: 02/27/2023 20:47    Procedures Procedures    Medications Ordered in ED Medications  sodium chloride 0.9 % bolus 1,000 mL (0 mLs Intravenous Stopped 02/27/23 2033)  ondansetron (ZOFRAN) injection 4 mg (4 mg Intravenous Given 02/27/23 1849)  metoCLOPramide (REGLAN) injection 10 mg (10 mg Intravenous Given 02/27/23 1923)  pantoprazole (PROTONIX) injection 40 mg (40 mg Intravenous Given 02/27/23 1921)  droperidol (INAPSINE) 2.5 MG/ML injection 1.25 mg (1.25 mg Intravenous Given 02/27/23 2034)  sodium chloride 0.9 % bolus 1,000 mL (1,000 mLs Intravenous New Bag/Given 02/27/23 2036)  iohexol (OMNIPAQUE) 350 MG/ML injection 75 mL (75 mLs Intravenous Contrast Given 02/27/23 2027)    ED Course/ Medical Decision Making/ A&P Clinical Course as of 02/27/23 2056  Sun Feb 27, 2023  1940 Lipase, blood(!) Slightly elevated, not consistent with a new onset  of pancreatitis [HS]  1940 Comprehensive metabolic panel(!) No gross electrolyte derangement, patient does have a mild AKI with a creatinine 1.4 [HS]  1940 CBC with Differential(!) Slight leukocytosis 11 with left shift [HS]  2054 Urinalysis, Routine w reflex microscopic -Urine, Clean Catch(!) neg [HS]  2054 CT Abdomen Pelvis W Contrast Findings congestive and acute colitis, [HS]    Clinical Course User Index [HS] Theron Arista, PA-C                             Medical Decision Making Amount and/or Complexity of Data Reviewed Labs: ordered. Decision-making details documented in ED Course. Radiology: ordered. Decision-making details documented in ED Course.  Risk Prescription drug management.   This is a 27 year old male presenting to the emergency department due to emesis Donnell pain.  Differential includes alcoholic gastritis, pancreatitis, gastritis, cholecystitis, GERD, perforated ulcer.  Considered dissection but given age, lack  of risk factors and upper and lower extremity pulses are symmetric bilaterally I do not really think that.  Also do not suspect this is atypical ACS.   Will start with laboratory workup, he does have some mild tenderness to the epigastric area this been ongoing for some time.  Pancreatitis is on the differential, will correspond to lipase levels.  There is no right upper quadrant tenderness or Murphy sign, lower suspicion for cholecystitis.  There is no rectal bleeding, no hematemesis, not consistent with a Boerhaave's esophagus and I do not think she is having a GI bleed.  Doubt perforated ulcer.  I ordered, viewed and interpreted laboratory workup as documented in ED course.  I reevaluated the patient, he has had 10 of Reglan, 40 Protonix, 4 Zofran.  1 L of fluids.  He is still feeling nauseated and having pain, actively dry heaving into trash can.  He has never had any CT of his abdomen although he has had a negative ultrasound in the past.  Given he is still having reproducible tenderness in the epigastric area we will proceed with CT imaging to evaluate for acute intra-abdominal process.  CT is notable for colitis.  Discussed the workup with the patient, he is feeling patient with droperidol.  He is requesting to go home at this time which I think is reasonable.  I considered admission for AKI, but patient is tolerating p.o. at this point and think close outpatient follow-up with gastroenterology is appropriate.  Return precaution discussed.        Final Clinical Impression(s) / ED Diagnoses Final diagnoses:  Nausea and vomiting, unspecified vomiting type  AKI (acute kidney injury) (HCC)  Epigastric pain  Colitis    Rx / DC Orders ED Discharge Orders          Ordered    metoCLOPramide (REGLAN) 10 MG tablet  Every 6 hours        02/27/23 2015    ondansetron (ZOFRAN-ODT) 8 MG disintegrating tablet  Every 8 hours PRN        02/27/23 2015    omeprazole (PRILOSEC) 40 MG capsule   Daily        02/27/23 2028              Theron Arista, PA-C 02/27/23 2056    Derwood Kaplan, MD 03/02/23 1535

## 2023-03-01 ENCOUNTER — Ambulatory Visit (INDEPENDENT_AMBULATORY_CARE_PROVIDER_SITE_OTHER): Payer: Commercial Managed Care - HMO | Admitting: Medical

## 2023-03-01 VITALS — BP 126/70 | HR 100 | Temp 98.6°F | Resp 18 | Ht 69.0 in | Wt 169.8 lb

## 2023-03-01 DIAGNOSIS — R1013 Epigastric pain: Secondary | ICD-10-CM

## 2023-03-01 DIAGNOSIS — R112 Nausea with vomiting, unspecified: Secondary | ICD-10-CM

## 2023-03-01 DIAGNOSIS — K219 Gastro-esophageal reflux disease without esophagitis: Secondary | ICD-10-CM | POA: Diagnosis not present

## 2023-03-01 LAB — CBC WITH DIFFERENTIAL/PLATELET
Basophils Absolute: 0 10*3/uL (ref 0.0–0.1)
Basophils Relative: 0.4 % (ref 0.0–3.0)
Eosinophils Absolute: 0 10*3/uL (ref 0.0–0.7)
Eosinophils Relative: 0.1 % (ref 0.0–5.0)
HCT: 42.1 % (ref 39.0–52.0)
Hemoglobin: 13.8 g/dL (ref 13.0–17.0)
Lymphocytes Relative: 18.8 % (ref 12.0–46.0)
Lymphs Abs: 2.1 10*3/uL (ref 0.7–4.0)
MCHC: 32.8 g/dL (ref 30.0–36.0)
MCV: 80.1 fl (ref 78.0–100.0)
Monocytes Absolute: 0.7 10*3/uL (ref 0.1–1.0)
Monocytes Relative: 6.3 % (ref 3.0–12.0)
Neutro Abs: 8.4 10*3/uL — ABNORMAL HIGH (ref 1.4–7.7)
Neutrophils Relative %: 74.4 % (ref 43.0–77.0)
Platelets: 348 10*3/uL (ref 150.0–400.0)
RBC: 5.26 Mil/uL (ref 4.22–5.81)
RDW: 13.8 % (ref 11.5–15.5)
WBC: 11.2 10*3/uL — ABNORMAL HIGH (ref 4.0–10.5)

## 2023-03-01 LAB — COMPREHENSIVE METABOLIC PANEL
ALT: 13 U/L (ref 0–53)
AST: 16 U/L (ref 0–37)
Albumin: 4.6 g/dL (ref 3.5–5.2)
Alkaline Phosphatase: 89 U/L (ref 39–117)
BUN: 10 mg/dL (ref 6–23)
CO2: 25 mEq/L (ref 19–32)
Calcium: 10.3 mg/dL (ref 8.4–10.5)
Chloride: 102 mEq/L (ref 96–112)
Creatinine, Ser: 1.39 mg/dL (ref 0.40–1.50)
GFR: 69.08 mL/min (ref >60.00)
Glucose, Bld: 99 mg/dL (ref 70–99)
Potassium: 4.1 mEq/L (ref 3.5–5.1)
Sodium: 139 mEq/L (ref 135–145)
Total Bilirubin: 1.2 mg/dL (ref 0.2–1.2)
Total Protein: 7.6 g/dL (ref 6.0–8.3)

## 2023-03-01 LAB — LIPASE: Lipase: 4 U/L — ABNORMAL LOW (ref 11.0–59.0)

## 2023-03-01 MED ORDER — SUCRALFATE 1 G PO TABS: 1.0000 g | ORAL_TABLET | Freq: Three times a day (TID) | ORAL | 0 refills | Status: DC

## 2023-03-01 MED ORDER — SUCRALFATE 1 G PO TABS: ORAL_TABLET | ORAL | 0 refills | Status: AC

## 2023-03-01 NOTE — Patient Instructions (Addendum)
1. Epigastric pain,  Nausea, vomiting and  Gastroesophageal reflux disease without esophagitis. Negative egd. Stopped marijuana and opiods more than a month ago. So favoring gerd flare with fried foods and alcohol recently. Evaluate lipase for pancreatitits. Add on extra famotadine 20 mg daily and carafate to current regimen omeprazole, reglan, zofran and current famotadine dose.   If lipase normal then eat small amount bland foods. Gerd education sheet provided.  May need to get you back in with GI but will follow labs and response to tx first.  - CBC w/Diff - Comp Met (CMET) - Lipase   Follow up in 7 days or sooner if needed.  Gastroesophageal Reflux Disease, Adult  Gastroesophageal reflux (GER) happens when acid from the stomach flows up into the tube that connects the mouth and the stomach (esophagus). Normally, food travels down the esophagus and stays in the stomach to be digested. With GER, food and stomach acid sometimes move back up into the esophagus. You may have a disease called gastroesophageal reflux disease (GERD) if the reflux: Happens often. Causes frequent or very bad symptoms. Causes problems such as damage to the esophagus. When this happens, the esophagus becomes sore and swollen. Over time, GERD can make small holes (ulcers) in the lining of the esophagus. What are the causes? This condition is caused by a problem with the muscle between the esophagus and the stomach. When this muscle is weak or not normal, it does not close properly to keep food and acid from coming back up from the stomach. The muscle can be weak because of: Tobacco use. Pregnancy. Having a certain type of hernia (hiatal hernia). Alcohol use. Certain foods and drinks, such as coffee, chocolate, onions, and peppermint. What increases the risk? Being overweight. Having a disease that affects your connective tissue. Taking NSAIDs, such a ibuprofen. What are the signs or  symptoms? Heartburn. Difficult or painful swallowing. The feeling of having a lump in the throat. A bitter taste in the mouth. Bad breath. Having a lot of saliva. Having an upset or bloated stomach. Burping. Chest pain. Different conditions can cause chest pain. Make sure you see your doctor if you have chest pain. Shortness of breath or wheezing. A long-term cough or a cough at night. Wearing away of the surface of teeth (tooth enamel). Weight loss. How is this treated? Making changes to your diet. Taking medicine. Having surgery. Treatment will depend on how bad your symptoms are. Follow these instructions at home: Eating and drinking  Follow a diet as told by your doctor. You may need to avoid foods and drinks such as: Coffee and tea, with or without caffeine. Drinks that contain alcohol. Energy drinks and sports drinks. Bubbly (carbonated) drinks or sodas. Chocolate and cocoa. Peppermint and mint flavorings. Garlic and onions. Horseradish. Spicy and acidic foods. These include peppers, chili powder, curry powder, vinegar, hot sauces, and BBQ sauce. Citrus fruit juices and citrus fruits, such as oranges, lemons, and limes. Tomato-based foods. These include red sauce, chili, salsa, and pizza with red sauce. Fried and fatty foods. These include donuts, french fries, potato chips, and high-fat dressings. High-fat meats. These include hot dogs, rib eye steak, sausage, ham, and bacon. High-fat dairy items, such as whole milk, butter, and cream cheese. Eat small meals often. Avoid eating large meals. Avoid drinking large amounts of liquid with your meals. Avoid eating meals during the 2-3 hours before bedtime. Avoid lying down right after you eat. Do not exercise right after you eat. Lifestyle  Do not smoke or use any products that contain nicotine or tobacco. If you need help quitting, ask your doctor. Try to lower your stress. If you need help doing this, ask your  doctor. If you are overweight, lose an amount of weight that is healthy for you. Ask your doctor about a safe weight loss goal. General instructions Pay attention to any changes in your symptoms. Take over-the-counter and prescription medicines only as told by your doctor. Do not take aspirin, ibuprofen, or other NSAIDs unless your doctor says it is okay. Wear loose clothes. Do not wear anything tight around your waist. Raise (elevate) the head of your bed about 6 inches (15 cm). You may need to use a wedge to do this. Avoid bending over if this makes your symptoms worse. Keep all follow-up visits. Contact a doctor if: You have new symptoms. You lose weight and you do not know why. You have trouble swallowing or it hurts to swallow. You have wheezing or a cough that keeps happening. You have a hoarse voice. Your symptoms do not get better with treatment. Get help right away if: You have sudden pain in your arms, neck, jaw, teeth, or back. You suddenly feel sweaty, dizzy, or light-headed. You have chest pain or shortness of breath. You vomit and the vomit is green, yellow, or black, or it looks like blood or coffee grounds. You faint. Your poop (stool) is red, bloody, or black. You cannot swallow, drink, or eat. These symptoms may represent a serious problem that is an emergency. Do not wait to see if the symptoms will go away. Get medical help right away. Call your local emergency services (911 in the [place] Do not drive yourself to the hospital. Summary If a person has gastroesophageal reflux disease (GERD), food and stomach acid move back up into the esophagus and cause symptoms or problems such as damage to the esophagus. Treatment will depend on how bad your symptoms are. Follow a diet as told by your doctor. Take all medicines only as told by your doctor. This information is not intended to replace advice given to you by your health care provider. Make sure you discuss any  questions you have with your health care provider. Document Revised: 06/16/2020 Document Reviewed: 06/16/2020 Elsevier Patient Education  [place].

## 2023-03-01 NOTE — Progress Notes (Signed)
Subjective:    Patient ID: Carlos Townsend, male    DOB: [birth year], 27 y.o.   MRN: [id number]  HPI Pt in for persisting stomach pain.   Pt vomited twice this morning. Pt has seen gi in past for abdomen pain and vomiting. Pt had negative egd. 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    Pt states he stopped smoking marijuana one month ago. None since. Pt also had been on opiods for shoulder pain.  Pt does note that certain foods will upset his stomach. Pt states example fried chicken upset his stomach and next day. This weekend had 7-8 shots of tequilla. Next day had stomach pain. He states he did not get drunk.   Pt is on both omepazole, zofran and reglan presently.   Since Friday of last week his stomach started to hurt again. Vomited twice today.   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  Patient states that he has been having trouble with his shoulder over the past year.  He tells me that he has been taking one of his relatives opioids.  He tells me that when he comes off the opioids he will have issues with nausea and vomiting.  It may be undigested food or bile.  This does not happen at night.  No blood.  No abdominal pain or weight loss.  He does smoke cannabis daily and has for some time (greater than a decade) but he does not feel that this is the cause for his issues with nausea and vomiting (it has been suggested to him by other providers).  His last such issues with nausea, vomiting or little over 2 weeks ago.  He was seen by his PCP at that time.  Reviewed.  Blood work from that day included normal comprehensive metabolic panel with normal liver test.  Nonelevated lipase.  Normal CBC.  Normal eosinophils.  Negative H. pylori antibody.  The patient also had imaging studies.   Plain abdominal films September 07, 2022 were normal.  Right upper quadrant ultrasound November 30, 2022 was unremarkable.  No gallstones.  He has been on PPI short-term.  Discontinued this as it did not help.  Not taking metoclopramide or Pepcid as it is on his list."   PLAN:   1.  Discussed cannabis hyperemesis syndrome 2.  Advised to stop smoking cannabis 3.  Planning upper endoscopy with biopsies to rule out other causes for recurrent nausea and vomiting.The nature of the procedure, as well as the risks, benefits, and alternatives were carefully and thoroughly reviewed with the patient. Ample time for discussion and questions allowed. The patient understood, was satisfied, and agreed to proceed. 4.  Resume general medical care with PCP"   Review of Systems  Constitutional:  Negative for chills, fatigue and fever.  Respiratory:  Negative for cough, chest tightness, shortness of breath and wheezing.   Cardiovascular:  Negative for chest pain and palpitations.  Gastrointestinal:  Negative for abdominal pain, blood in stool, nausea and rectal pain.  Genitourinary:  Negative for dysuria and frequency.  Musculoskeletal:  Negative for back pain and joint swelling.  Skin:  Negative for rash.  Neurological:  Negative for dizziness, tremors, speech difficulty, weakness, numbness and headaches.  Hematological:  Negative for adenopathy. Does not bruise/bleed easily.  Psychiatric/Behavioral:  Negative for behavioral problems and dysphoric mood.     Past Medical History:  Diagnosis Date   Stomach ulcer      Social History   Socioeconomic History   Marital status: Single    Spouse name: Not on file   Number of children: Not on file   Years of education: Not on file   Highest education level: Not on file  Occupational History   Not on file  Tobacco Use   Smoking status: Never    Passive exposure: Yes   Smokeless tobacco: Never  Vaping Use   Vaping Use: Never used  Substance and Sexual  Activity   Alcohol use: Yes    Comment: social   Drug use: Yes    Types: Marijuana    Comment: 3 joints a day.; smoked earlier today   Sexual activity: Yes    Birth control/protection: Condom  Other Topics Concern   Not on file  Social History Narrative   Not on file   Social Determinants of Health   Financial Resource Strain: Not on file  Food Insecurity: Not on file  Transportation Needs: Not on file  Physical Activity: Not on file  Stress: Not on file  Social Connections: Not on file  Intimate Partner Violence: Not on file    Past Surgical History:  Procedure Laterality Date   ANKLE FRACTURE SURGERY     SHOULDER SURGERY      Family History  Problem Relation Age of Onset   Hyperlipidemia Father    Hypertension Father    Diabetes Neg Hx    Heart attack Neg Hx    Sudden death Neg Hx     No Known Allergies  Current Outpatient Medications on File Prior to Visit  Medication Sig Dispense Refill   metoCLOPramide (REGLAN) 10 MG tablet Take 1 tablet (10 mg total) by mouth every 6 (six) hours. 30 tablet 0   omeprazole (PRILOSEC) 40 MG capsule Take 1 capsule (40 mg total) by mouth daily. 30 capsule 0   ondansetron (ZOFRAN) 4 MG tablet Take 1 tablet (4 mg total) by mouth every 8 (eight) hours as needed for nausea or vomiting. (Patient not taking: Reported on 01/07/2023) 20 tablet 0   ondansetron (ZOFRAN-ODT) 8 MG disintegrating tablet Take 1 tablet (8 mg total) by mouth every 8 (eight) hours as needed for nausea or vomiting. 20 tablet 0   No current facility-administered medications on file prior to visit.    BP 126/70   Pulse 100   Temp 98.6 F (37 C)   Resp 18   Ht '5\' 9"'$  (1.753 m)   Wt 169 lb 12.8 oz (77 kg)   SpO2 98%   BMI 25.08 kg/m        Objective:   Physical Exam  General Mental Status- Alert. General Appearance- Not in acute distress.   Skin General: Color- Normal Color. Moisture- Normal Moisture.  Neck Carotid Arteries- Normal color. Moisture-  Normal Moisture. No carotid bruits. No JVD.  Chest and Lung Exam Auscultation: Breath Sounds:-Normal.  Cardiovascular Auscultation:Rythm- Regular. Murmurs & Other Heart Sounds:Auscultation of the heart reveals- No Murmurs.  Abdomen Inspection:-Inspeection Normal. Palpation/Percussion:Note:No mass. Palpation and Percussion of the abdomen reveal- mild epigagsgric Tender, Non Distended + BS, no rebound or guarding.   Neurologic Cranial Nerve exam:- CN III-XII intact(No nystagmus), symmetric smile. Strength:- 5/5 equal and symmetric strength both upper and lower extremities.       Assessment & Plan:   . Epigastric pain,  Nausea, vomiting  and  Gastroesophageal reflux disease without esophagitis. Negative egd. Stopped marijuana and opiods more than a month ago. So favoring gerd flare with fried foods and alcohol recently. Evaluate lipase for pancreatitits. Add on extra famotadine 20 mg daily and carafate to current regimen omeprazole, reglan, zofran and current famotadine dose.   If lipase normal then eat small amount bland foods. Gerd education sheet provided.  May need to get you back in with GI but will follow labs and response to tx first.  - CBC w/Diff - Comp Met (CMET) - Lipase   Follow up in 7 days or sooner if needed.  Mackie Pai, PA-C

## 2023-03-03 NOTE — Addendum Note (Signed)
Addended by: Anabel Halon on: 03/03/2023 05:29 AM   Modules accepted: Orders

## 2023-03-04 ENCOUNTER — Encounter: Payer: Self-pay | Admitting: Medical

## 2023-03-04 ENCOUNTER — Telehealth: Payer: Self-pay | Admitting: Internal Medicine

## 2023-03-04 NOTE — Telephone Encounter (Signed)
Patient called states he has been throwing up for a few days and has not been able to eat.

## 2023-03-04 NOTE — Telephone Encounter (Signed)
Pt stated that he has had N/V for 6 days. Upper abdominal pain. No constipation. Last BM 2 days ago.  Poor appetite due to N/V Pt went to the ED on 02/27/2023. Ct scan done. Discharged home on Reglan, Zofran, Prilosec Pt went to PCP on 03/01/2023 . Carafate added to medications. Pt has follow up appointment to see PCP on 03/08/2023 Pt was encouraged push fluids, Gatorade/ BRAT diet. Continue medications as prescribed. Pt scheduled for an office visit to see Vicie Mutters PA  at 1:30 PM: Pt made aware. Pt verbalized understanding with all questions answered.

## 2023-03-05 ENCOUNTER — Emergency Department (HOSPITAL_COMMUNITY)
Admission: EM | Admit: 2023-03-05 | Discharge: 2023-03-05 | Disposition: A | Discharge status: Home / Self Care | Payer: Commercial Managed Care - HMO | Attending: Emergency Medicine | Admitting: Emergency Medicine

## 2023-03-05 ENCOUNTER — Encounter (HOSPITAL_COMMUNITY): Payer: Self-pay

## 2023-03-05 ENCOUNTER — Other Ambulatory Visit: Payer: Self-pay

## 2023-03-05 DIAGNOSIS — K2970 Gastritis, unspecified, without bleeding: Secondary | ICD-10-CM

## 2023-03-05 DIAGNOSIS — R112 Nausea with vomiting, unspecified: Secondary | ICD-10-CM | POA: Diagnosis present

## 2023-03-05 MED ORDER — LIDOCAINE VISCOUS HCL 2 % MT SOLN
15.0000 mL | Freq: Once | OROMUCOSAL | Status: AC
  Administered 2023-03-05: 15 mL via ORAL
  Filled 2023-03-05: qty 15

## 2023-03-05 MED ORDER — ALUM & MAG HYDROXIDE-SIMETH 200-200-20 MG/5ML PO SUSP
30.0000 mL | Freq: Once | ORAL | Status: AC
  Administered 2023-03-05: 30 mL via ORAL
  Filled 2023-03-05: qty 30

## 2023-03-05 MED ORDER — CIPROFLOXACIN HCL 500 MG PO TABS: 500.0000 mg | ORAL_TABLET | Freq: Two times a day (BID) | ORAL | 0 refills | Status: AC

## 2023-03-05 MED ORDER — OMEPRAZOLE 40 MG PO CPDR: 40.0000 mg | DELAYED_RELEASE_CAPSULE | Freq: Two times a day (BID) | ORAL | 0 refills | Status: AC

## 2023-03-05 MED ORDER — SUCRALFATE 1 G PO TABS
1.0000 g | ORAL_TABLET | Freq: Once | ORAL | Status: AC
  Administered 2023-03-05: 1 g via ORAL
  Filled 2023-03-05: qty 1

## 2023-03-05 NOTE — ED Triage Notes (Signed)
Pt returns after being seen 3/10 for N/V. Pt was dx with ulcers, and was given meds. Pt states he has scheduled a follow up with GI 3/28, but cannot wait to see them. Pt states that he has continued to have abd pain and nausea.

## 2023-03-05 NOTE — Discharge Instructions (Addendum)
Your exam today is overall reassuring.  You received a couple medications in the emergency department including Carafate.  Carafate has been prescribed to you previously.  Continue taking this medication along with omeprazole which we increased to twice daily today.  Recommend restarting the famotidine that was prescribed by your primary care provider.  I have sent in Zofran for you to keep on hand as needed for nausea and vomiting.  Continue drinking plenty of fluids.  For any concerning symptoms return to the emergency department.  Otherwise follow-up with your primary care provider and gastroenterologist.

## 2023-03-05 NOTE — ED Provider Notes (Signed)
[facility] Provider Note   CSN: [id number] Arrival date & time: 03/05/23  1051     History  Chief Complaint  Patient presents with   Vomiting    Carlos Townsend is a 27 y.o. male.  27 year old male presents today for evaluation of epigastric abdominal pain that has been ongoing for some time.  He states over the past week or so he has had some nausea and vomiting.  He has been able to keep down fluids without any difficulty.  He states solid foods he is unable to keep down.  No blood in the emesis.  No blood in his stools.  No melanotic stools.  Recently seen in the emergency department.  Prescribed medications for gastritis.  Followed up with PCP and had couple more medications added.  He states he has stopped taking the famotidine because he felt it did not help.  The history is provided by the patient. No language interpreter was used.       Home Medications Prior to Admission medications   Medication Sig Start Date End Date Taking? Authorizing Provider  metoCLOPramide (REGLAN) 10 MG tablet Take 1 tablet (10 mg total) by mouth every 6 (six) hours. 02/27/23   Sherrill Raring, PA-C  omeprazole (PRILOSEC) 40 MG capsule Take 1 capsule (40 mg total) by mouth 2 (two) times daily. 03/05/23   Deatra Canter, Carrah Eppolito, PA-C  ondansetron (ZOFRAN) 4 MG tablet Take 1 tablet (4 mg total) by mouth every 8 (eight) hours as needed for nausea or vomiting. Patient not taking: Reported on 01/07/2023 11/30/22   Saguier, Percell Miller, PA-C  ondansetron (ZOFRAN-ODT) 8 MG disintegrating tablet Take 1 tablet (8 mg total) by mouth every 8 (eight) hours as needed for nausea or vomiting. 02/27/23   Sherrill Raring, PA-C  sucralfate (CARAFATE) 1 g tablet 1 tab po tid 03/01/23   Saguier, Percell Miller, PA-C      Allergies    Patient has no known allergies.    Review of Systems   Review of Systems  Constitutional:  Negative for chills and fever.  Cardiovascular:  Negative for chest pain.   Gastrointestinal:  Positive for abdominal pain, nausea and vomiting. Negative for blood in stool, constipation and diarrhea.  Neurological:  Negative for light-headedness.  All other systems reviewed and are negative.   Physical Exam Updated Vital Signs BP 128/84   Pulse 88   Temp 98.9 F (37.2 C) (Oral)   Resp 16   Ht 5\' 9"  (1.753 m)   Wt 76.7 kg   SpO2 100%   BMI 24.96 kg/m  Physical Exam Vitals and nursing note reviewed.  Constitutional:      General: He is not in acute distress.    Appearance: Normal appearance. He is not ill-appearing.  HENT:     Head: Normocephalic and atraumatic.     Nose: Nose normal.  Eyes:     General: No scleral icterus.    Extraocular Movements: Extraocular movements intact.     Conjunctiva/sclera: Conjunctivae normal.  Cardiovascular:     Rate and Rhythm: Normal rate and regular rhythm.     Pulses: Normal pulses.  Pulmonary:     Effort: Pulmonary effort is normal. No respiratory distress.     Breath sounds: Normal breath sounds. No wheezing or rales.  Abdominal:     General: There is no distension.     Tenderness: There is no abdominal tenderness. There is no guarding.  Musculoskeletal:  General: Normal range of motion.     Cervical back: Normal range of motion.  Skin:    General: Skin is warm and dry.  Neurological:     General: No focal deficit present.     Mental Status: He is alert. Mental status is at baseline.     ED Results / Procedures / Treatments   Labs (all labs ordered are listed, but only abnormal results are displayed) Labs Reviewed - No data to display  EKG None  Radiology No results found.  Procedures Procedures    Medications Ordered in ED Medications  alum & mag hydroxide-simeth (MAALOX/MYLANTA) 200-200-20 MG/5ML suspension 30 mL (has no administration in time range)    And  lidocaine (XYLOCAINE) 2 % viscous mouth solution 15 mL (has no administration in time range)  sucralfate (CARAFATE)  tablet 1 g (has no administration in time range)    ED Course/ Medical Decision Making/ A&P                             Medical Decision Making Risk OTC drugs. Prescription drug management.   Medical Decision Making / ED Course   This patient presents to the ED for concern of abdominal pain, this involves an extensive number of treatment options, and is a complaint that carries with it a high risk of complications and morbidity.  The differential diagnosis includes gastritis, gastroenteritis, colitis, pancreatitis, diverticulitis, appendicitis  MDM: 27 year old male presents today for evaluation of nausea, vomiting, epigastric abdominal pain.  He states previously he was diagnosed with gastritis.  He believes this is flaring up due to his diet.  Denies any illicit drug use including marijuana.  With the exception of famotidine he reports compliance with his home medications.  Recently on the CT scan he was noted to have colitis.  This was not addressed.  Given persistent symptoms will start patient on antibiotics to cover for this in case this is contributing to his symptoms.  Blood work deferred.  He did have renal insufficiency at the last ED visit however on follow-up with his PCP this had resolved.  Low suspicion for appendicitis given he has no right lower quadrant abdominal tenderness, afebrile.  Discussed he needs to restart famotidine.  Will increase omeprazole to twice daily.  Will start patient on Cipro to cover for colitis.  Strict return precautions discussed.  Discussed follow-up with PCP and gastroenterology.   Lab Tests: -I ordered, reviewed, and interpreted labs.   The pertinent results include:   Labs Reviewed - No data to display    EKG  EKG Interpretation  Date/Time:    Ventricular Rate:    PR Interval:    QRS Duration:   QT Interval:    QTC Calculation:   R Axis:     Text Interpretation:          Medicines ordered and prescription drug management: Meds  ordered this encounter  Medications   AND Linked Order Group    alum & mag hydroxide-simeth (MAALOX/MYLANTA) 200-200-20 MG/5ML suspension 30 mL    lidocaine (XYLOCAINE) 2 % viscous mouth solution 15 mL   sucralfate (CARAFATE) tablet 1 g   omeprazole (PRILOSEC) 40 MG capsule    Sig: Take 1 capsule (40 mg total) by mouth 2 (two) times daily.    Dispense:  30 capsule    Refill:  0    Order Specific Question:   Supervising Provider    Answer:  [place], [place]    -I have reviewed the patients home medicines and have made adjustments as needed   Reevaluation: After the interventions noted above, I reevaluated the patient and found that they have :stayed the same  Co morbidities that complicate the patient evaluation  Past Medical History:  Diagnosis Date   Stomach ulcer       Dispostion: Patient is appropriate for discharge.  Discharged in stable condition.  Return precaution discussed.  Patient voices understanding and is in agreement with plan.  Final Clinical Impression(s) / ED Diagnoses Final diagnoses:  Gastritis, presence of bleeding unspecified, unspecified chronicity, unspecified gastritis type    Rx / DC Orders ED Discharge Orders          Ordered    omeprazole (PRILOSEC) 40 MG capsule  2 times daily        03/05/23 1137    ciprofloxacin (CIPRO) 500 MG tablet  Every 12 hours        03/05/23 [place], [place], PA-C 03/05/23 [place], [place], DO 03/05/23 1159

## 2023-03-08 ENCOUNTER — Ambulatory Visit: Payer: Commercial Managed Care - HMO | Admitting: Medical

## 2023-03-16 NOTE — Progress Notes (Signed)
03/17/2023 Maximous Suire [id number] 28-Oct-1996  Referring provider: Mackie Pai, PA-C Primary GI doctor: Dr. Henrene Pastor  ASSESSMENT AND PLAN:   27 year old male with history of AB pain and nausea presents for follow up for ER visit for transverse and ascending colitis EGD 12/2022 normal RUQ US unremarkable Treated with cipro, still some AB pain.  Will check labs including labs for inflammation Most likely an infectious colitis, will need to rule out inflammatory colitis with patient's symptoms to rule out Crohn's.  plan for colonoscopy 6 to 8 weeks out to evaluate for inflammatory colitis Long discussion with the patient that if this colonoscopy is negative then it is most positively from cannabinoid hyperemesis syndrome and he must stop marijuana use for at least a year in order to see a difference and should not restart. Given Levsin and Elavil for abdominal pain and nausea  We have discussed the risks of bleeding, infection, perforation, medication reactions, and remote risk of death associated with colonoscopy. All questions were answered and the patient acknowledges these risk and wishes to proceed.    Patient Care Team: Saguier, Iris Pert as PCP - General (Internal Medicine)  HISTORY OF PRESENT ILLNESS: 27 y.o. male with a past medical history of marijuana use and others listed below presents for evaluation of colitis.   12/14/2022 office visit with Dr. Henrene Pastor for recurrent nausea and vomiting for 6 months Instructed to stop cannabis 11/30/2022 RUQ US unremarkable, PPI short-term no help. 01/07/2023 EGD Dr. Henrene Pastor for nausea with vomiting showed normal esophagus, normal stomach, normal duodenum.  Negative H. pylori.  02/17/2023 patient went to the ER with nausea vomiting upper mid abdominal pain.  No labs were taken at that time. 02/27/2023 Patient returned to the ER with worsening epigastric pain, nausea vomiting AKI, CBC showed WBC 11, Hgb 14, lipase 59, BUN 8,  creatinine 1.41, normal liver function 02/27/2023 CT abdomen pelvis with contrast showed mild mural thickening of the ascending and transverse colon suggestive of colitis.  No pancreatitis. Was having some alcohol use over the weekend.  Quit smoking marijuana 2 months ago.  Mom sharon is with her.  Mom with thyroid issues, No other autoimmune issues.  States he will have at least one day a month of nausea, vomiting, AB pain.  Normally last for 1 day but this past time lasted for a week.  During that week he had nausea, vomiting, AB pain, was having chills, no fever.  No BM during that time. Was waking up with nausea and vomiting, has not had any since that time.  He has history of BM day, hard stools, since the ER he has been having BM every other day.  Can have feeling of needing to go to the ER.  No melena, no hematochezia.  About 10 lbs weight loss since Jan.  Wt Readings from Last 6 Encounters:  03/17/23 165 lb 8 oz (75.1 kg)  03/05/23 169 lb (76.7 kg)  03/01/23 169 lb 12.8 oz (77 kg)  02/27/23 170 lb (77.1 kg)  01/07/23 169 lb (76.7 kg)  01/03/23 174 lb (78.9 kg)   He denies NSAIDS.  Denies ETOH. Denies tobacco use.  Marijuana use daily.   He  reports that he has never smoked. He has been exposed to tobacco smoke. He has never used smokeless tobacco. He reports current alcohol use. He reports current drug use. Drug: Marijuana.  RELEVANT LABS AND IMAGING: CBC    Component Value Date/Time   WBC 11.2 (H) 03/01/2023  0955   RBC 5.26 03/01/2023 0955   HGB 13.8 03/01/2023 0955   HCT 42.1 03/01/2023 0955   PLT 348.0 03/01/2023 0955   MCV 80.1 03/01/2023 0955   MCH 26.3 02/27/2023 1816   MCHC 32.8 03/01/2023 0955   RDW 13.8 03/01/2023 0955   LYMPHSABS 2.1 03/01/2023 0955   MONOABS 0.7 03/01/2023 0955   EOSABS 0.0 03/01/2023 0955   BASOSABS 0.0 03/01/2023 0955   Recent Labs    08/28/22 2011 11/30/22 1245 02/27/23 1816 03/01/23 0955  HGB 14.2 14.3 14.2 13.8     CMP      Component Value Date/Time   NA 139 03/01/2023 0955   K 4.1 03/01/2023 0955   CL 102 03/01/2023 0955   CO2 25 03/01/2023 0955   GLUCOSE 99 03/01/2023 0955   BUN 10 03/01/2023 0955   CREATININE 1.39 03/01/2023 0955   CALCIUM 10.3 03/01/2023 0955   PROT 7.6 03/01/2023 0955   ALBUMIN 4.6 03/01/2023 0955   AST 16 03/01/2023 0955   ALT 13 03/01/2023 0955   ALKPHOS 89 03/01/2023 0955   BILITOT 1.2 03/01/2023 0955   GFRNONAA >60 02/27/2023 1816      Latest Ref Rng & Units 03/01/2023    9:55 AM 02/27/2023    6:16 PM 11/30/2022   12:45 PM  Hepatic Function  Total Protein 6.0 - 8.3 g/dL 7.6  7.7  7.7   Albumin 3.5 - 5.2 g/dL 4.6  4.6  4.9   AST 0 - 37 U/L 16  21  16    ALT 0 - 53 U/L 13  16  15    Alk Phosphatase 39 - 117 U/L 89  93  91   Total Bilirubin 0.2 - 1.2 mg/dL 1.2  1.1  1.1       Current Medications:        Current Outpatient Medications (Other):    amitriptyline (ELAVIL) 25 MG tablet, Take 1 tablet (25 mg total) by mouth at bedtime. 1/2-1 pill as needed for nausea/vomiting   ciprofloxacin (CIPRO) 500 MG tablet, Take 1 tablet (500 mg total) by mouth every 12 (twelve) hours.   hyoscyamine (LEVSIN SL) 0.125 MG SL tablet, Place 1 tablet (0.125 mg total) under the tongue every 6 (six) hours as needed for cramping (nausea, diarrhea).   metoCLOPramide (REGLAN) 10 MG tablet, Take 1 tablet (10 mg total) by mouth every 6 (six) hours.   Na Sulfate-K Sulfate-Mg Sulf 17.5-3.13-1.6 GM/177ML SOLN, Take 1 kit by mouth once for 1 dose.   omeprazole (PRILOSEC) 40 MG capsule, Take 1 capsule (40 mg total) by mouth 2 (two) times daily.   ondansetron (ZOFRAN) 4 MG tablet, Take 1 tablet (4 mg total) by mouth every 8 (eight) hours as needed for nausea or vomiting.   sucralfate (CARAFATE) 1 g tablet, 1 tab po tid   ondansetron (ZOFRAN-ODT) 8 MG disintegrating tablet, Take 1 tablet (8 mg total) by mouth every 8 (eight) hours as needed for nausea or vomiting. (Patient not taking: Reported on  03/17/2023)  Medical History:  Past Medical History:  Diagnosis Date   Stomach ulcer    Allergies: No Known Allergies   Surgical History:  He  has a past surgical history that includes Ankle fracture surgery and Shoulder surgery. Family History:  His family history includes Hyperlipidemia in his father; Hypertension in his father.  REVIEW OF SYSTEMS  : All other systems reviewed and negative except where noted in the History of Present Illness.  PHYSICAL EXAM: BP 110/70  Pulse 80   Ht 5\' 9"  (1.753 m)   Wt 165 lb 8 oz (75.1 kg)   BMI 24.44 kg/m  General Appearance: Well nourished, in no apparent distress. Head:   Normocephalic and atraumatic. Eyes:  sclerae anicteric,conjunctive pink  Respiratory: Respiratory effort normal, BS equal bilaterally without rales, rhonchi, wheezing. Cardio: RRR with no MRGs. Peripheral pulses intact.  Abdomen: Soft,  Flat ,active bowel sounds. mild tenderness in the upper abdomen. Without guarding and Without rebound. No masses. Rectal: Not evaluated Musculoskeletal: Full ROM, Normal gait. Without edema. Skin:  Dry and intact without significant lesions or rashes Neuro: Alert and  oriented x4;  No focal deficits. Psych:  Cooperative. Normal mood and affect.    Vladimir Crofts, PA-C 3:22 PM

## 2023-03-17 ENCOUNTER — Other Ambulatory Visit: Payer: Self-pay

## 2023-03-17 ENCOUNTER — Other Ambulatory Visit (INDEPENDENT_AMBULATORY_CARE_PROVIDER_SITE_OTHER): Payer: Commercial Managed Care - HMO

## 2023-03-17 ENCOUNTER — Encounter: Payer: Self-pay | Admitting: Physician Assistant

## 2023-03-17 ENCOUNTER — Ambulatory Visit: Payer: Commercial Managed Care - HMO | Admitting: Physician Assistant

## 2023-03-17 VITALS — BP 110/70 | HR 80 | Ht 69.0 in | Wt 165.5 lb

## 2023-03-17 DIAGNOSIS — F1210 Cannabis abuse, uncomplicated: Secondary | ICD-10-CM | POA: Diagnosis not present

## 2023-03-17 DIAGNOSIS — R112 Nausea with vomiting, unspecified: Secondary | ICD-10-CM

## 2023-03-17 DIAGNOSIS — R1010 Upper abdominal pain, unspecified: Secondary | ICD-10-CM

## 2023-03-17 DIAGNOSIS — K529 Noninfective gastroenteritis and colitis, unspecified: Secondary | ICD-10-CM

## 2023-03-17 LAB — COMPREHENSIVE METABOLIC PANEL
ALT: 8 U/L (ref 0–53)
AST: 13 U/L (ref 0–37)
Albumin: 4.2 g/dL (ref 3.5–5.2)
Alkaline Phosphatase: 62 U/L (ref 39–117)
BUN: 9 mg/dL (ref 6–23)
CO2: 33 mEq/L — ABNORMAL HIGH (ref 19–32)
Calcium: 9.1 mg/dL (ref 8.4–10.5)
Chloride: 103 mEq/L (ref 96–112)
Creatinine, Ser: 1.19 mg/dL (ref 0.40–1.50)
GFR: 83.21 mL/min (ref >60.00)
Glucose, Bld: 84 mg/dL (ref 70–99)
Potassium: 3.9 mEq/L (ref 3.5–5.1)
Sodium: 139 mEq/L (ref 135–145)
Total Bilirubin: 0.5 mg/dL (ref 0.2–1.2)
Total Protein: 6.6 g/dL (ref 6.0–8.3)

## 2023-03-17 LAB — CBC WITH DIFFERENTIAL/PLATELET
Basophils Absolute: 0.1 10*3/uL (ref 0.0–0.1)
Basophils Relative: 0.6 % (ref 0.0–3.0)
Eosinophils Absolute: 0.2 10*3/uL (ref 0.0–0.7)
Eosinophils Relative: 2.5 % (ref 0.0–5.0)
HCT: 38.8 % — ABNORMAL LOW (ref 39.0–52.0)
Hemoglobin: 12.6 g/dL — ABNORMAL LOW (ref 13.0–17.0)
Lymphocytes Relative: 44.8 % (ref 12.0–46.0)
Lymphs Abs: 3.6 10*3/uL (ref 0.7–4.0)
MCHC: 32.6 g/dL (ref 30.0–36.0)
MCV: 81 fl (ref 78.0–100.0)
Monocytes Absolute: 0.9 10*3/uL (ref 0.1–1.0)
Monocytes Relative: 10.8 % (ref 3.0–12.0)
Neutro Abs: 3.4 10*3/uL (ref 1.4–7.7)
Neutrophils Relative %: 41.3 % — ABNORMAL LOW (ref 43.0–77.0)
Platelets: 262 10*3/uL (ref 150.0–400.0)
RBC: 4.79 Mil/uL (ref 4.22–5.81)
RDW: 13.8 % (ref 11.5–15.5)
WBC: 8.1 10*3/uL (ref 4.0–10.5)

## 2023-03-17 LAB — IRON: Iron: 84 ug/dL (ref 42–165)

## 2023-03-17 LAB — SEDIMENTATION RATE: Sed Rate: 3 mm/hr (ref 0–15)

## 2023-03-17 LAB — HIGH SENSITIVITY CRP: CRP, High Sensitivity: <0.2 mg/L (ref 0.000–5.000)

## 2023-03-17 LAB — FERRITIN: Ferritin: 135.7 ng/mL (ref 22.0–322.0)

## 2023-03-17 MED ORDER — NA SULFATE-K SULFATE-MG SULF 17.5-3.13-1.6 GM/177ML PO SOLN: 1.0000 | Freq: Once | ORAL | 0 refills | Status: AC

## 2023-03-17 MED ORDER — HYOSCYAMINE SULFATE 0.125 MG SL SUBL: 0.1250 mg | SUBLINGUAL_TABLET | Freq: Four times a day (QID) | SUBLINGUAL | 1 refills | Status: AC | PRN

## 2023-03-17 MED ORDER — AMITRIPTYLINE HCL 25 MG PO TABS: 25.0000 mg | ORAL_TABLET | Freq: Every day | ORAL | 0 refills | Status: DC

## 2023-03-17 NOTE — Patient Instructions (Addendum)
Your provider has requested that you go to the basement level for lab work before leaving today. Press "B" on the elevator. The lab is located at the first door on the left as you exit the elevator.  We have sent the following medications to your pharmacy for you to pick up at your convenience: Levsin, and Elavil  First do a trial off milk/lactose products if you use them.  Add fiber like benefiber or citracel once a day Increase activity Can do trial of IBGard which is over the counter for AB pain- Take 1-2 capsules once a day for maintence or twice a day during a flare Can send in an anti spasm medication, Levsin, to take as needed  Can take amitriptyline as needed for nausea/vomiting/AB pain  Please try low FODMAP diet- see below- start with eliminating just one column at a time, the table at the very bottom contains foods that are safe to take   FODMAP stands for fermentable oligo-, di-, mono-saccharides and polyols (1). These are the scientific terms used to classify groups of carbs that are notorious for triggering digestive symptoms like bloating, gas and stomach pain.      Cannabinoid Hyperemesis Syndrome  What is cannabinoid hyperemesis syndrome?  Cannabinoid hyperemesis syndrome (CHS) is a condition that leads to repeated and severe bouts of vomiting. It is rare and only occurs in daily long-term users of marijuana.  Marijuana has several active substances. These include THC and related chemicals. These substances bind to molecules found in the brain. That causes the drug "high" and other effects that users feel.  Your digestive tract also has a number of molecules that bind to [facility] and related substances. So marijuana also affects the digestive tract. For example, the drug can change the time it takes the stomach to empty. It also affects the esophageal sphincter. That's the tight band of muscle that opens and closes to let food from the esophagus into the stomach. Long-term  marijuana use can change the way the affected molecules respond and lead to the symptoms of CHS.  Marijuana is the most widely used recreational drug in the [place] adults are the most frequent users. A small number of these people develop CHS. It often only happens in people who have regularly used marijuana. Often CHS affects those who use the drug at least once a day.  What causes cannabinoid hyperemesis syndrome?  Marijuana has very complex effects on the body. Experts are still trying to learn exactly how it causes CHS in some people.  In the brain, marijuana often has the opposite effect of CHS. It helps prevent nausea and vomiting. The drug is also good at stopping such symptoms in people having chemotherapy.  But in the digestive tract, marijuana seems to have the opposite effect. It actually makes you more likely to have nausea and vomiting. With the first use of marijuana, the signals from the brain may be more important. That may lead to anti-nausea effects at first. But with repeated use of marijuana, certain receptors in the brain may stop responding to the drug in the same way. That may cause the repeated bouts of vomiting found in people with CHS.  It still isn't clear why some heavy marijuana users get the syndrome, but others don't.  What are the symptoms of cannabinoid hyperemesis syndrome?  People with CHS suffer from repeated bouts of vomiting. In between these episodes are times without any symptoms. Healthcare providers often divide these symptoms into 3 stages:  the prodromal phase, the hyperemetic phase, and the recovery phase.  Prodromal phase. During this phase, the main symptoms are often early morning nausea and belly (abdominal) pain. Some people also develop a fear of vomiting. Most people keep normal eating patterns during this time. Some people use more marijuana because they think it will help stop the nausea. This phase may last for months or  years.  Hyperemetic phase. Symptoms during this time may include:  Ongoing nausea  Repeated episodes of vomiting  Belly pain  Decreased food intake and weight loss  Symptoms of fluid loss (dehydration)  During this phase, vomiting is often intense and overwhelming. Many people take a lot of hot showers during the day. They find that doing so eases their nausea. (That may be because of how the hot temperature affects a part of the brain called the hypothalamus. This part of the brain effects both temperature regulation and vomiting.) People often first seek medical care during this phase.  The hyperemetic phase may continue until the person completely stops using marijuana. Then the recovery phase starts.  Recovery phase. During this time, symptoms go away. Normal eating is possible again. This phase can last days or months. Symptoms often come back if the person tries marijuana again.  How is cannabinoid hyperemesis syndrome diagnosed?  Many health problems can cause repeated vomiting. To make a diagnosis, your healthcare provider will ask you about your symptoms and your past health. He or she will also do a physical exam, including an exam of your belly. Often this diagnosis can be made from history alone, however.  Your healthcare provider may also need more tests to rule out other causes of the vomiting.  CHS was only recently discovered. So some healthcare providers may not know about it. As a result, they may not spot it for many years. They often confuse CHS with cyclical vomiting disorder. That is a health problem that causes similar symptoms. A specialist trained in diseases of the digestive tract (gastroenterologist) might make the diagnosis.  You may have CHS if you have all of these:  Long-term weekly and daily marijuana use  Belly pain  Severe, repeated nausea and vomiting  You feel better after taking a hot shower  There is no single test that confirms this  diagnosis. Only improvement after quitting marijuana confirms the diagnosis.  How is cannabinoid hyperemesis syndrome treated?  To fully get better, you need to stop using marijuana all together. If you stop using marijuana, your symptoms should not come back.  Symptoms often ease after a day or 2 unless marijuana is used before this time.  In a small sample of people with CHS, rubbing capsaicin cream on the belly helped decrease pain and nausea. The chemicals in the cream have the same effect as a hot shower.  Quitting marijuana may lead to other health benefits, such as:  Better lung function  Improved memory and thinking skills  Better sleep  Key points about cannabinoid hyperemesis syndrome  CHS is a condition that leads to repeated and severe bouts of vomiting. It results from long-term use of marijuana.  Most people self-treat using hot showers to help reduce their symptoms.  Some people with CHS may not be diagnosed for several years. Admitting to your healthcare provider that you use marijuana daily can speed up the diagnosis.  Symptoms start to go away within a day or 2 after stopping marijuana use.  Symptoms can come back if you use marijuana again  Thank you for choosing me and [facility].  Vicie Mutters, PA-C

## 2023-03-17 NOTE — Progress Notes (Signed)
Assessment and plan noted ?

## 2023-04-17 ENCOUNTER — Other Ambulatory Visit: Payer: Self-pay | Admitting: Physician Assistant

## 2023-04-17 DIAGNOSIS — R1010 Upper abdominal pain, unspecified: Secondary | ICD-10-CM

## 2023-04-17 DIAGNOSIS — R112 Nausea with vomiting, unspecified: Secondary | ICD-10-CM

## 2023-05-02 ENCOUNTER — Encounter: Payer: Self-pay | Admitting: Internal Medicine

## 2023-05-10 ENCOUNTER — Telehealth: Payer: Self-pay | Admitting: Physician Assistant

## 2023-05-10 ENCOUNTER — Telehealth: Payer: Self-pay | Admitting: Internal Medicine

## 2023-05-10 ENCOUNTER — Encounter: Payer: Self-pay | Admitting: Internal Medicine

## 2023-05-10 ENCOUNTER — Other Ambulatory Visit: Payer: Self-pay

## 2023-05-10 MED ORDER — NA SULFATE-K SULFATE-MG SULF 17.5-3.13-1.6 GM/177ML PO SOLN: ORAL | 0 refills | Status: AC

## 2023-05-10 NOTE — Telephone Encounter (Signed)
PT cancelled colonoscopy for tomorrow at 130pm

## 2023-05-10 NOTE — Telephone Encounter (Signed)
Patient has a procedure tomorrow and stated that pharmacy do not have the SUPREP.Marland KitchenMarland KitchenPlease advise.

## 2023-05-10 NOTE — Telephone Encounter (Signed)
Prescription has been sent to pharmacy.

## 2023-05-11 ENCOUNTER — Encounter: Payer: Commercial Managed Care - HMO | Admitting: Internal Medicine

## 2023-05-27 ENCOUNTER — Other Ambulatory Visit: Payer: Self-pay

## 2023-05-27 DIAGNOSIS — R1010 Upper abdominal pain, unspecified: Secondary | ICD-10-CM

## 2023-05-27 DIAGNOSIS — R112 Nausea with vomiting, unspecified: Secondary | ICD-10-CM

## 2023-05-27 MED ORDER — AMITRIPTYLINE HCL 25 MG PO TABS: ORAL_TABLET | ORAL | 0 refills | Status: AC

## 2024-01-24 ENCOUNTER — Ambulatory Visit: Payer: Commercial Managed Care - HMO | Admitting: Medical

## 2024-01-24 VITALS — BP 122/76 | HR 89 | Temp 98.0°F | Resp 16 | Ht 69.0 in | Wt 168.8 lb

## 2024-01-24 DIAGNOSIS — R6882 Decreased libido: Secondary | ICD-10-CM | POA: Diagnosis not present

## 2024-01-24 DIAGNOSIS — N528 Other male erectile dysfunction: Secondary | ICD-10-CM

## 2024-01-24 DIAGNOSIS — R5383 Other fatigue: Secondary | ICD-10-CM

## 2024-01-24 DIAGNOSIS — K529 Noninfective gastroenteritis and colitis, unspecified: Secondary | ICD-10-CM

## 2024-01-24 NOTE — Patient Instructions (Signed)
 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 Reports withdrawal symptoms including stomach pain when not taking Suboxone. Expressed interest in tapering off Suboxone in the future. -Continue current Suboxone regimen as prescribed by [facility]. -Consider referral to a specialist for assistance with future tapering off Suboxone.(when desired/ready) Ask current clinic about this as well.  Low libido, ED and fatigue. suspect low T. Reports decreased libido and erectile dysfunction for the past 3 months while on Suboxone. No recent testosterone level checked. -Order labs including testosterone panel, B12, thyroid, TSH, metabolic panel, and CBC to evaluate for causes of symptoms. Instructed patient to have labs drawn early in the morning for accurate testosterone measurement. -If testosterone is low, refer to endocrinologist for further evaluation and management, with consideration of fertility preservation.  Chronic Shoulder Pain History of right shoulder pain, previously evaluated with imaging. No recent follow-up with orthopedist. -Consider referral for re-evaluation of chronic shoulder pain if it continues to be a significant issue for the patient.  Follow up date to be determined.

## 2024-01-24 NOTE — Progress Notes (Signed)
 Subjective:    Patient ID: Carlos Townsend, male    DOB: [birth year], 28 y.o.   MRN: [id number]  HPI Discussed the use of AI scribe software for clinical note transcription with the patient, who gave verbal consent to proceed.  History of Present Illness   [name] is a 28 year old male with opioid use disorder on Suboxone who presents with concerns about low testosterone and erectile dysfunction.  He has been experiencing low testosterone levels, which he associates with erectile dysfunction and decreased libido, for about three months. These symptoms began after starting Suboxone approximately a year and a half ago as part of a clinic program to manage opioid use disorder. He has not yet had his testosterone levels checked.  He has been on Suboxone for opioid use disorder, initially using Percocet obtained from the street for shoulder pain. Suboxone helps alleviate stomach pain associated with withdrawal symptoms. He experiences stomach pain if he does not take Suboxone, which he describes as a withdrawal symptom. He has attempted to taper off Suboxone but experienced difficulty eating  due to epigastric pain. Has seen GI MD as well. See that work up.  He continues to experience chronic pain in his right shoulder and neck, which was initially managed with Percocet and later with tramadol . He has not followed up on previous imaging studies, including an X-ray of his shoulder and neck, and continues to experience persistent pain in these areas.  He has undergone a colonoscopy and endoscopy, which did not reveal any significant findings. Marijuana use reportedly alleviates his stomach pain. 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   He occasionally experiences fatigue but denies any consistent pattern. He has one child and another on the way, and he wants to have more children in the future.       Past Medical History:  Diagnosis Date   Stomach ulcer       Social History   Socioeconomic History   Marital status: Single    Spouse name: Not on file   Number of children: Not on file   Years of education: Not on file   Highest education level: Not on file  Occupational History   Not on file  Tobacco Use   Smoking status: Never    Passive exposure: Yes   Smokeless tobacco: Never  Vaping Use   Vaping status: Never Used  Substance and Sexual Activity   Alcohol use: Yes    Comment: social   Drug use: Yes    Types: Marijuana    Comment: 3 joints a day.; smoked earlier today   Sexual activity: Yes    Birth control/protection: Condom  Other Topics Concern   Not on file  Social History Narrative   Not on file   Social Drivers of Health   Financial Resource Strain: Low Risk  (07/22/2021)   Received from [facility], [facility]   Overall Financial Resource Strain (CARDIA)    Difficulty of Paying Living Expenses: Not hard at all  Food Insecurity: No Food Insecurity (07/27/2022)   Received from [facility], [facility]   Hunger Vital Sign    Worried About Running Out of Food in the Last Year: Never true    Ran Out of Food in the Last Year: Never true  Transportation Needs: No Transportation Needs (07/22/2021)   Received from [facility], [facility]   [facility] - Transportation    Lack of Transportation (Medical): No  Lack of Transportation (Non-Medical): No  Physical Activity: Insufficiently Active (07/22/2021)   Received from [facility], [facility]   Exercise Vital Sign    Days of Exercise per Week: 2 days    Minutes of Exercise per Session: 10 min  Stress: No Stress Concern Present (07/22/2021)   Received from [facility], [facility] - Occupational Stress Questionnaire    Feeling of Stress : Not at all  Social Connections: Unknown (05/01/2022)   Received from [facility], [facility]   Social Network    Social Network: Not on file  Intimate Partner Violence:  Unknown (03/23/2022)   Received from [facility], [facility]   HITS    Physically Hurt: Not on file    Insult or Talk Down To: Not on file    Threaten Physical Harm: Not on file    Scream or Curse: Not on file    Past Surgical History:  Procedure Laterality Date   ANKLE FRACTURE SURGERY     SHOULDER SURGERY      Family History  Problem Relation Age of Onset   Hyperlipidemia Father    Hypertension Father    Diabetes Neg Hx    Heart attack Neg Hx    Sudden death Neg Hx     No Known Allergies  Current Outpatient Medications on File Prior to Visit  Medication Sig Dispense Refill   amitriptyline  (ELAVIL ) 25 MG tablet TAKE 1/2 TO 1 TABLET BY MOUTH AS NEEDED FOR NAUSEA AND VOMITING 30 tablet 0   ciprofloxacin  (CIPRO ) 500 MG tablet Take 1 tablet (500 mg total) by mouth every 12 (twelve) hours. 10 tablet 0   hyoscyamine  (LEVSIN  SL) 0.125 MG SL tablet Place 1 tablet (0.125 mg total) under the tongue every 6 (six) hours as needed for cramping (nausea, diarrhea). 50 tablet 1   metoCLOPramide  (REGLAN ) 10 MG tablet Take 1 tablet (10 mg total) by mouth every 6 (six) hours. 30 tablet 0   Na Sulfate-K Sulfate-Mg Sulf 17.5-3.13-1.6 GM/177ML SOLN Take as directed 354 mL 0   omeprazole  (PRILOSEC) 40 MG capsule Take 1 capsule (40 mg total) by mouth 2 (two) times daily. 30 capsule 0   ondansetron  (ZOFRAN ) 4 MG tablet Take 1 tablet (4 mg total) by mouth every 8 (eight) hours as needed for nausea or vomiting. 20 tablet 0   ondansetron  (ZOFRAN -ODT) 8 MG disintegrating tablet Take 1 tablet (8 mg total) by mouth every 8 (eight) hours as needed for nausea or vomiting. 20 tablet 0   sucralfate  (CARAFATE ) 1 g tablet 1 tab po tid 90 tablet 0   No current facility-administered medications on file prior to visit.    BP 122/76   Pulse 89   Temp 98 F (36.7 C) (Oral)   Resp 16   Ht 5' 9 (1.753 m)   Wt 168 lb 12.8 oz (76.6 kg)   SpO2 97%   BMI 24.93 kg/m      Review of Systems See hpi     Objective:   Physical Exam General- No acute distress. Pleasant patient. Neck- Full range of motion, no jvd Lungs- Clear, even and unlabored. Heart- regular rate and rhythm. Neurologic- CNII- XII grossly intact.  Abdomen- soft, nt, nd, +bs, no rebound or guarding. Back- no cva tenderness.       Assessment & Plan:   Patient Instructions  Opioid Dependence On Suboxone for approximately 2 years, managed by [facility]. Reports withdrawal symptoms including stomach  pain when not taking Suboxone. Expressed interest in tapering off Suboxone in the future. -Continue current Suboxone regimen as prescribed by [facility]. -Consider referral to a specialist for assistance with future tapering off Suboxone.(when desired/ready) Ask current clinic about this as well.  Low libido, ED and fatigue. suspect low T. Reports decreased libido and erectile dysfunction for the past 3 months while on Suboxone. No recent testosterone level checked. -Order labs including testosterone panel, B12, thyroid, TSH, metabolic panel, and CBC to evaluate for causes of symptoms. Instructed patient to have labs drawn early in the morning for accurate testosterone measurement. -If testosterone is low, refer to endocrinologist for further evaluation and management, with consideration of fertility preservation.  Chronic Shoulder Pain History of right shoulder pain, previously evaluated with imaging. No recent follow-up with orthopedist. -Consider referral for re-evaluation of chronic shoulder pain if it continues to be a significant issue for the patient.  Follow up date to be determined.

## 2024-01-27 ENCOUNTER — Other Ambulatory Visit: Payer: Commercial Managed Care - HMO

## 2024-03-26 ENCOUNTER — Encounter: Payer: Self-pay | Admitting: *Deleted

## 2025-01-01 ENCOUNTER — Telehealth: Payer: Self-pay

## 2025-01-01 NOTE — Telephone Encounter (Signed)
 The patient called to schedule an appointment with our practice, stating that his provider sent a referral on December 27, 2024. Upon review, no referral for this year was found in the system. The patient was informed that he is currently established with [facility]. He stated that he spoke with his PCP and wishes to transfer his care to our practice. He was advised to have a referral sent to us , after which we can proceed with scheduling him with one of our providers.

## 2025-01-03 ENCOUNTER — Encounter: Payer: Self-pay | Admitting: Medical

## 2025-01-03 NOTE — Addendum Note (Signed)
 Addended by: DORINA DALLAS DORINA PA-C M on: 01/03/2025 09:28 PM   Modules accepted: Orders
